# Patient Record
Sex: Male | Born: 1975
Health system: Southern US, Community
[De-identification: ages and names within clinical notes are randomized; demographics above are authoritative.]

## PROBLEM LIST (undated history)

## (undated) HISTORY — PX: NO PAST SURGERIES: SHX2092

---

## 2015-03-30 ENCOUNTER — Emergency Department
Admission: EM | Admit: 2015-03-30 | Discharge: 2015-03-30 | Disposition: A | Discharge status: Home / Self Care | Payer: Medicaid Other | Attending: Emergency Medicine | Admitting: Emergency Medicine

## 2015-03-30 ENCOUNTER — Emergency Department: Payer: Medicaid Other

## 2015-03-30 ENCOUNTER — Encounter: Payer: Self-pay | Admitting: *Deleted

## 2015-03-30 DIAGNOSIS — R079 Chest pain, unspecified: Secondary | ICD-10-CM | POA: Diagnosis present

## 2015-03-30 DIAGNOSIS — F17200 Nicotine dependence, unspecified, uncomplicated: Secondary | ICD-10-CM | POA: Diagnosis not present

## 2015-03-30 DIAGNOSIS — J209 Acute bronchitis, unspecified: Secondary | ICD-10-CM | POA: Diagnosis not present

## 2015-03-30 LAB — CBC
HCT: 45.1 % (ref 40.0–52.0)
Hemoglobin: 14.9 g/dL (ref 13.0–18.0)
MCH: 30.1 pg (ref 26.0–34.0)
MCHC: 33 g/dL (ref 32.0–36.0)
MCV: 91.2 fL (ref 80.0–100.0)
PLATELETS: 287 10*3/uL (ref 150–440)
RBC: 4.95 MIL/uL (ref 4.40–5.90)
RDW: 13.2 % (ref 11.5–14.5)
WBC: 8 10*3/uL (ref 3.8–10.6)

## 2015-03-30 LAB — BASIC METABOLIC PANEL
Anion gap: 4 — ABNORMAL LOW (ref 5–15)
BUN: 14 mg/dL (ref 6–20)
CALCIUM: 8.7 mg/dL — AB (ref 8.9–10.3)
CO2: 27 mmol/L (ref 22–32)
CREATININE: 0.91 mg/dL (ref 0.61–1.24)
Chloride: 106 mmol/L (ref 101–111)
GFR calc non Af Amer: >60 mL/min (ref >60)
GLUCOSE: 101 mg/dL — AB (ref 65–99)
Potassium: 3.8 mmol/L (ref 3.5–5.1)
Sodium: 137 mmol/L (ref 135–145)

## 2015-03-30 LAB — TROPONIN I: Troponin I: <0.03 ng/mL (ref <0.031)

## 2015-03-30 MED ORDER — GUAIFENESIN 100 MG/5ML PO SOLN: 5.0000 mL | ORAL | Status: DC | PRN

## 2015-03-30 MED ORDER — AZITHROMYCIN 250 MG PO TABS: ORAL_TABLET | ORAL | Status: DC

## 2015-03-30 MED ORDER — HYDROCODONE-HOMATROPINE 5-1.5 MG/5ML PO SYRP: 5.0000 mL | ORAL_SOLUTION | Freq: Four times a day (QID) | ORAL | Status: DC | PRN

## 2015-03-30 NOTE — ED Notes (Signed)
Pt reports left side chest pain.  Pt recently had a cold and cough and hurts in left rib area.  No n/v/d.  No diaphoresis.  No sob.  cig smoker.  Pt alert

## 2015-03-30 NOTE — ED Provider Notes (Signed)
[facility] Emergency Department Provider Note  ____________________________________________  Time seen: 6:15 PM  I have reviewed the triage vital signs and the nursing notes.   HISTORY  Chief Complaint Chest Pain    HPI Jobie Popp is a 40 y.o. male who complains of left chest wall pain over the last week. It hurts when he coughs or when he moves or lies on his left side. Not exertional. No shortness of breath diaphoresis vomiting or dizziness. He is a smoker. He's had productive cough and a cold recently about 2 weeks ago with persistence of these symptoms. With forceful coughing is developed chest wall pain. His cough had become productive but is cleared again. He has not had any fever.     No past medical history on file.   There are no active problems to display for this patient.    No past surgical history on file.   Current Outpatient Rx  Name  Route  Sig  Dispense  Refill  . azithromycin (ZITHROMAX Z-PAK) 250 MG tablet      Take 2 tablets (500 mg) on  Day 1,  followed by 1 tablet (250 mg) once daily on Days 2 through 5.   6 each   0   . guaiFENesin (ROBITUSSIN) 100 MG/5ML SOLN   Oral   Take 5 mLs (100 mg total) by mouth every 4 (four) hours as needed for cough or to loosen phlegm.   120 mL   0   . HYDROcodone-homatropine (HYCODAN) 5-1.5 MG/5ML syrup   Oral   Take 5 mLs by mouth every 6 (six) hours as needed for cough.   120 mL   0      Allergies Review of patient's allergies indicates no known allergies.   No family history on file.  Social History Social History  Substance Use Topics  . Smoking status: Current Every Day Smoker  . Smokeless tobacco: Not on file  . Alcohol Use: Yes    Review of Systems  Constitutional:   No fever or chills. No weight changes Eyes:   No blurry vision or double vision.  ENT:   No sore throat. Cardiovascular:   Positive as above chest pain. Respiratory:   No dyspnea or  cough. Gastrointestinal:   Negative for abdominal pain, vomiting and diarrhea.  No BRBPR or melena. Genitourinary:   Negative for dysuria, urinary retention, bloody urine, or difficulty urinating. Musculoskeletal:   Negative for back pain. No joint swelling or pain. Skin:   Negative for rash. Neurological:   Negative for headaches, focal weakness or numbness. Psychiatric:  No anxiety or depression.   Endocrine:  No hot/cold intolerance, changes in energy, or sleep difficulty.  10-point ROS otherwise negative.  ____________________________________________   PHYSICAL EXAM:  VITAL SIGNS: ED Triage Vitals  Enc Vitals Group     BP 03/30/15 1548 154/81 mmHg     Pulse Rate 03/30/15 1548 88     Resp 03/30/15 1548 20     Temp 03/30/15 1548 98.3 F (36.8 C)     Temp Source 03/30/15 1548 Oral     SpO2 03/30/15 1548 99 %     Weight 03/30/15 1548 180 lb (81.647 kg)     Height 03/30/15 1548  (1.651 m)     Head Cir --      Peak Flow --      Pain Score 03/30/15 1550 8     Pain Loc --      Pain Edu? --  Excl. in GC? --     Vital signs reviewed, nursing assessments reviewed.   Constitutional:   Alert and oriented. Well appearing and in no distress. Eyes:   No scleral icterus. No conjunctival pallor. PERRL. EOMI ENT   Head:   Normocephalic and atraumatic.   Nose:   No congestion/rhinnorhea. No septal hematoma   Mouth/Throat:   MMM, no pharyngeal erythema. No peritonsillar mass. No uvula shift.   Neck:   No stridor. No SubQ emphysema. No meningismus. Hematological/Lymphatic/Immunilogical:   No cervical lymphadenopathy. Cardiovascular:   RRR. Normal and symmetric distal pulses are present in all extremities. No murmurs, rubs, or gallops. Respiratory:   Normal respiratory effort without tachypnea nor retractions. Breath sounds are clear and equal bilaterally. No wheezes/rales/rhonchi. Gastrointestinal:   Soft and nontender. No distention. There is no CVA tenderness.   No rebound, rigidity, or guarding. Genitourinary:   deferred Musculoskeletal:   Nontender with normal range of motion in all extremities. No joint effusions.  No lower extremity tenderness.  No edema. Left chest wall tender to the touch in the anterior inferior ribs which reproduces the pain. Neurologic:   Normal speech and language.  CN 2-10 normal. Motor grossly intact. No pronator drift.  Normal gait. No gross focal neurologic deficits are appreciated.  Skin:    Skin is warm, dry and intact. No rash noted.  No petechiae, purpura, or bullae. Psychiatric:   Mood and affect are normal. Speech and behavior are normal. Patient exhibits appropriate insight and judgment.  ____________________________________________    LABS (pertinent positives/negatives) (all labs ordered are listed, but only abnormal results are displayed) Labs Reviewed  BASIC METABOLIC PANEL - Abnormal; Notable for the following:    Glucose, Bld 101 (*)    Calcium 8.7 (*)    Anion gap 4 (*)    All other components within normal limits  TROPONIN I  CBC   ____________________________________________   EKG  Interpreted by me  Date: 03/30/2015  Rate: 81  Rhythm: normal sinus rhythm  QRS Axis: normal  Intervals: normal  ST/T Wave abnormalities: normal  Conduction Disutrbances: none  Narrative Interpretation: unremarkable      ____________________________________________    RADIOLOGY  Chest x-ray unremarkable  ____________________________________________   PROCEDURES   ____________________________________________   INITIAL IMPRESSION / ASSESSMENT AND PLAN / ED COURSE  Pertinent labs & imaging results that were available during my care of the patient were reviewed by me and considered in my medical decision making (see chart for details).  Patient presents with chest wall pain reproducible on exam. Symptomatically this is consistent with acute bronchitis related to recent viral illness. Low  suspicion for pneumonia as his white blood cell count and body temperature and other vital signs are unremarkable today. Lung exam is reassuring as well. He is very well-appearing.  Considering the patient's symptoms, medical history, and physical examination today, I have low suspicion for ACS, PE, TAD, pneumothorax, carditis, mediastinitis, pneumonia, CHF, or sepsis.  We'll discharge home with symptomatic treatment. NSAIDs, Hycodan, azithromycin if fever develops.     ____________________________________________   FINAL CLINICAL IMPRESSION(S) / ED DIAGNOSES  Final diagnoses:  Acute bronchitis, unspecified organism      Sharman Cheek, MD 03/30/15 1901

## 2015-03-30 NOTE — Discharge Instructions (Signed)

## 2015-03-30 NOTE — ED Notes (Signed)
MD Stafford at bedside. 

## 2017-09-06 ENCOUNTER — Other Ambulatory Visit: Payer: Self-pay

## 2017-09-06 ENCOUNTER — Emergency Department
Admission: EM | Admit: 2017-09-06 | Discharge: 2017-09-06 | Disposition: A | Discharge status: Home / Self Care | Payer: Self-pay | Attending: Emergency Medicine | Admitting: Emergency Medicine

## 2017-09-06 DIAGNOSIS — T310 Burns involving less than 10% of body surface: Secondary | ICD-10-CM | POA: Insufficient documentation

## 2017-09-06 DIAGNOSIS — T304 Corrosion of unspecified body region, unspecified degree: Secondary | ICD-10-CM

## 2017-09-06 DIAGNOSIS — T65891A Toxic effect of other specified substances, accidental (unintentional), initial encounter: Secondary | ICD-10-CM | POA: Insufficient documentation

## 2017-09-06 DIAGNOSIS — Y999 Unspecified external cause status: Secondary | ICD-10-CM | POA: Insufficient documentation

## 2017-09-06 DIAGNOSIS — T22542A Corrosion of first degree of left axilla, initial encounter: Secondary | ICD-10-CM | POA: Insufficient documentation

## 2017-09-06 DIAGNOSIS — T22541A Corrosion of first degree of right axilla, initial encounter: Secondary | ICD-10-CM | POA: Insufficient documentation

## 2017-09-06 DIAGNOSIS — Y939 Activity, unspecified: Secondary | ICD-10-CM | POA: Insufficient documentation

## 2017-09-06 DIAGNOSIS — Y929 Unspecified place or not applicable: Secondary | ICD-10-CM | POA: Insufficient documentation

## 2017-09-06 DIAGNOSIS — Z87891 Personal history of nicotine dependence: Secondary | ICD-10-CM | POA: Insufficient documentation

## 2017-09-06 DIAGNOSIS — X58XXXA Exposure to other specified factors, initial encounter: Secondary | ICD-10-CM | POA: Insufficient documentation

## 2017-09-06 MED ORDER — HYDROCORTISONE 2.5 % EX OINT: TOPICAL_OINTMENT | Freq: Two times a day (BID) | CUTANEOUS | 0 refills | Status: DC

## 2017-09-06 NOTE — ED Triage Notes (Addendum)
Pt reports burning, swelling, irritation under both arms - this started this am - he states it is from the deoderant that he uses

## 2017-09-06 NOTE — ED Notes (Signed)
See triage note  Presents with irritation under both arms  No abscess noted  Thinks this be the cause of the irritation

## 2017-09-06 NOTE — ED Provider Notes (Signed)
[facility] Emergency Department Provider Note  ____________________________________________  Time seen: Approximately 11:52 AM  I have reviewed the triage vital signs and the nursing notes.   HISTORY  Chief Complaint irritation under arms   HPI Bryan Garrett is a 42 y.o. male who presents to the emergency department for treatment and evaluation of skin irritation related to deodorant.  Patient states that he purchased a deodorant gel instead of his regular type of deodorant and has been using it for the past 4 to 5 days.  Last night, he noticed that both of his underarms were burning and very irritated.  The sensation continued overnight and this morning he has noticed that his skin is peeling and is red.  No alleviating measures were attempted prior to arrival.   History reviewed. No pertinent past medical history.  There are no active problems to display for this patient.   History reviewed. No pertinent surgical history.  Prior to Admission medications   Medication Sig Start Date End Date Taking? Authorizing Provider  azithromycin (ZITHROMAX Z-PAK) 250 MG tablet Take 2 tablets (500 mg) on  Day 1,  followed by 1 tablet (250 mg) once daily on Days 2 through 5. 03/30/15   Sharman CheekStafford, Phillip, MD  guaiFENesin (ROBITUSSIN) 100 MG/5ML SOLN Take 5 mLs (100 mg total) by mouth every 4 (four) hours as needed for cough or to loosen phlegm. 03/30/15   Sharman CheekStafford, Phillip, MD  HYDROcodone-homatropine [facility](HYCODAN) 5-1.5 MG/5ML syrup Take 5 mLs by mouth every 6 (six) hours as needed for cough. 03/30/15   Sharman CheekStafford, Phillip, MD  hydrocortisone 2.5 % ointment Apply topically 2 (two) times daily. 09/06/17   Chinita Pesterriplett, Liana Camerer B, FNP    Allergies Patient has no known allergies.  No family history on file.  Social History Social History   Tobacco Use  . Smoking status: Former Games developermoker  . Smokeless tobacco: Never Used  Substance Use Topics  . Alcohol use: Yes    Comment: occ  .  Drug use: Never    Review of Systems  Constitutional: Negative for fever. Respiratory: Negative for cough or shortness of breath.  Musculoskeletal: Negative for myalgias Skin: Positive for skin irritation under both arms Neurological: Negative for numbness or paresthesias. ____________________________________________   PHYSICAL EXAM:  VITAL SIGNS: ED Triage Vitals  Enc Vitals Group     BP 09/06/17 1039 (!) 148/99     Pulse Rate 09/06/17 1039 69     Resp --      Temp 09/06/17 1039 98.4 F (36.9 C)     Temp Source 09/06/17 1039 Oral     SpO2 09/06/17 1039 97 %     Weight 09/06/17 1039 170 lb (77.1 kg)     Height 09/06/17 1039 5\' 5"  (1.651 m)     Head Circumference --      Peak Flow --      Pain Score 09/06/17 1046 7     Pain Loc --      Pain Edu? --      Excl. in GC? --      Constitutional: Well appearing. Eyes: Conjunctivae are clear without discharge or drainage. Nose: No rhinorrhea noted. Mouth/Throat: Airway is patent.  Neck: No stridor. Unrestricted range of motion observed. Cardiovascular: Capillary refill is <3 seconds.  Respiratory: Respirations are even and unlabored.. Musculoskeletal: Unrestricted range of motion observed. Neurologic: Awake, alert, and oriented x 4.  Skin: Bilateral axilla erythematous without fissures or drainage.  ____________________________________________   LABS (all labs ordered  are listed, but only abnormal results are displayed)  Labs Reviewed - No data to display ____________________________________________  EKG  Not indicated. ____________________________________________  RADIOLOGY  Not indicated ____________________________________________   PROCEDURES  Procedures ____________________________________________   INITIAL IMPRESSION / ASSESSMENT AND PLAN / ED COURSE  Bryan Garrett is a 42 y.o. male who presents to the emergency department for treatment and evaluation of bilateral axilla irritation secondary  to a new deodorant.  Patient was encouraged to dispose of that deodorant and avoid using any type of deodorant that contains aluminum.  He will be prescribed hydrocortisone cream and encouraged to use it 2 times per day until healed.  He is to follow-up with the primary care provider of his choice for symptoms that are not improving over the next few days or return to the emergency department for symptoms of change or worsen.   Medications - No data to display   Pertinent labs & imaging results that were available during my care of the patient were reviewed by me and considered in my medical decision making (see chart for details).  ____________________________________________   FINAL CLINICAL IMPRESSION(S) / ED DIAGNOSES  Final diagnoses:  Chemical burn    ED Discharge Orders        Ordered    hydrocortisone 2.5 % ointment  2 times daily     09/06/17 1150       Note:  This document was prepared using Dragon voice recognition software and may include unintentional dictation errors.    Chinita Pester, FNP 09/06/17 1155    Nita Sickle, MD 09/07/17 [phone]

## 2018-03-19 ENCOUNTER — Other Ambulatory Visit: Payer: Self-pay

## 2018-03-19 ENCOUNTER — Emergency Department
Admission: EM | Admit: 2018-03-19 | Discharge: 2018-03-19 | Disposition: A | Discharge status: Home / Self Care | Payer: Medicaid Other | Attending: Emergency Medicine | Admitting: Emergency Medicine

## 2018-03-19 DIAGNOSIS — R300 Dysuria: Secondary | ICD-10-CM | POA: Diagnosis present

## 2018-03-19 DIAGNOSIS — Z87891 Personal history of nicotine dependence: Secondary | ICD-10-CM | POA: Insufficient documentation

## 2018-03-19 DIAGNOSIS — A64 Unspecified sexually transmitted disease: Secondary | ICD-10-CM | POA: Diagnosis not present

## 2018-03-19 DIAGNOSIS — R369 Urethral discharge, unspecified: Secondary | ICD-10-CM | POA: Insufficient documentation

## 2018-03-19 LAB — CHLAMYDIA/NGC RT PCR (ARMC ONLY)
CHLAMYDIA TR: NOT DETECTED
N gonorrhoeae: NOT DETECTED

## 2018-03-19 MED ORDER — CEFTRIAXONE SODIUM 250 MG IJ SOLR
250.0000 mg | Freq: Once | INTRAMUSCULAR | Status: AC
  Administered 2018-03-19: 250 mg via INTRAMUSCULAR
  Filled 2018-03-19: qty 250

## 2018-03-19 MED ORDER — AZITHROMYCIN 500 MG PO TABS
1000.0000 mg | ORAL_TABLET | Freq: Once | ORAL | Status: AC
Start: 2018-03-19 — End: 2018-03-19
  Administered 2018-03-19: 1000 mg via ORAL
  Filled 2018-03-19: qty 2

## 2018-03-19 NOTE — ED Provider Notes (Signed)
[facility] Emergency Department Provider Note   ____________________________________________    I have reviewed the triage vital signs and the nursing notes.   HISTORY  Chief Complaint Groin Swelling     HPI Bryan Garrett is a 43 y.o. male who presents with complaints of dysuria and penile discharge.  This morning he thought his left testicle was tender but no longer.  Recent new sexual partner.   History reviewed. No pertinent past medical history.  There are no active problems to display for this patient.   History reviewed. No pertinent surgical history.  Prior to Admission medications   Medication Sig Start Date End Date Taking? Authorizing Provider  azithromycin (ZITHROMAX Z-PAK) 250 MG tablet Take 2 tablets (500 mg) on  Day 1,  followed by 1 tablet (250 mg) once daily on Days 2 through 5. 03/30/15   Sharman Cheek, MD  guaiFENesin (ROBITUSSIN) 100 MG/5ML SOLN Take 5 mLs (100 mg total) by mouth every 4 (four) hours as needed for cough or to loosen phlegm. 03/30/15   Sharman Cheek, MD  HYDROcodone-homatropine [facility]) 5-1.5 MG/5ML syrup Take 5 mLs by mouth every 6 (six) hours as needed for cough. 03/30/15   Sharman Cheek, MD  hydrocortisone 2.5 % ointment Apply topically 2 (two) times daily. 09/06/17   Chinita Pester, FNP     Allergies Patient has no known allergies.  History reviewed. No pertinent family history.  Social History Social History   Tobacco Use  . Smoking status: Former Games developer  . Smokeless tobacco: Never Used  Substance Use Topics  . Alcohol use: Yes    Comment: occ  . Drug use: Never    Review of Systems  Constitutional: No fever/chills  ENT: No sore throat.   Gastrointestinal: No abdominal pain.  No nausea, no vomiting.   Genitourinary: As above Musculoskeletal: No joint pain Skin: Negative for rash.     ____________________________________________   PHYSICAL EXAM:  VITAL  SIGNS: ED Triage Vitals  Enc Vitals Group     BP 03/19/18 1417 (!) 149/94     Pulse Rate 03/19/18 1417 62     Resp 03/19/18 1417 16     Temp 03/19/18 1417 98.4 F (36.9 C)     Temp Source 03/19/18 1417 Oral     SpO2 03/19/18 1417 100 %     Weight 03/19/18 1416 75.8 kg (167 lb)     Height 03/19/18 1416 1.651 m (5\' 5" )     Head Circumference --      Peak Flow --      Pain Score 03/19/18 1416 6     Pain Loc --      Pain Edu? --      Excl. in GC? --      Constitutional: Alert and oriented. No acute distress. P Eyes: Conjunctivae are normal.    Mouth/Throat: Mucous membranes are moist.   Cardiovascular: Normal rate, regular rhythm.  Respiratory: Normal respiratory effort.  No retractions. Genitourinary: Clear penile discharge, no rash, no testicular swelling or tenderness Musculoskeletal: No joint pain or swelling Neurologic:  Normal speech and language. No gross focal neurologic deficits are appreciated.   Skin:  Skin is warm, dry and intact. No rash noted.   ____________________________________________   LABS (all labs ordered are listed, but only abnormal results are displayed)  Labs Reviewed  CHLAMYDIA/NGC RT PCR [facility] ONLY)   ____________________________________________  EKG   ____________________________________________  RADIOLOGY   ____________________________________________   PROCEDURES  Procedure(s) performed:  No  Procedures   Critical Care performed: No ____________________________________________   INITIAL IMPRESSION / ASSESSMENT AND PLAN / ED COURSE  Pertinent labs & imaging results that were available during my care of the patient were reviewed by me and considered in my medical decision making (see chart for details).  Patient presentation concerning for STI, will treat with Rocephin and azithromycin.   ____________________________________________   FINAL CLINICAL IMPRESSION(S) / ED DIAGNOSES  Final diagnoses:  STI  (sexually transmitted infection)      NEW MEDICATIONS STARTED DURING THIS VISIT:  New Prescriptions   No medications on file     Note:  This document was prepared using Dragon voice recognition software and may include unintentional dictation errors.   Jene EveryKinner, Celes Dedic, MD 03/19/18 [phone]

## 2018-03-19 NOTE — ED Notes (Signed)
Med hold until 1510.

## 2018-03-19 NOTE — ED Notes (Signed)
Pt reports that he began to notice two bumps to testicles today with some left sided testicular swelling and penile discharge.

## 2018-03-19 NOTE — Discharge Instructions (Signed)
No sexual activity x 1 week. Recommend follow up with health department for additional std testing

## 2018-03-19 NOTE — ED Triage Notes (Signed)
Pt A&O, ambulatory. No distress noted. States L testicle is swollen. Noticed yesterday. No discharge. "this morning I noticed 2 like bumps also"

## 2019-11-09 ENCOUNTER — Ambulatory Visit: Admit: 2019-11-09 | Discharge status: Absent without leave | Payer: Self-pay

## 2019-11-13 ENCOUNTER — Encounter: Payer: Self-pay | Admitting: Emergency Medicine

## 2019-11-13 ENCOUNTER — Ambulatory Visit
Admission: RE | Admit: 2019-11-13 | Disposition: A | Discharge status: Home / Self Care | Payer: Self-pay | Source: Ambulatory Visit

## 2019-11-13 ENCOUNTER — Other Ambulatory Visit: Payer: Self-pay

## 2019-11-13 ENCOUNTER — Ambulatory Visit
Admission: EM | Admit: 2019-11-13 | Discharge: 2019-11-13 | Disposition: A | Discharge status: Home / Self Care | Payer: Medicaid Other | Attending: Physician Assistant | Admitting: Physician Assistant

## 2019-11-13 ENCOUNTER — Telehealth (HOSPITAL_COMMUNITY): Payer: Self-pay | Admitting: Nurse Practitioner

## 2019-11-13 DIAGNOSIS — Z20822 Contact with and (suspected) exposure to covid-19: Secondary | ICD-10-CM | POA: Diagnosis not present

## 2019-11-13 DIAGNOSIS — U071 COVID-19: Secondary | ICD-10-CM

## 2019-11-13 MED ORDER — AMOXICILLIN-POT CLAVULANATE 875-125 MG PO TABS: 1.0000 | ORAL_TABLET | Freq: Two times a day (BID) | ORAL | 0 refills | Status: DC

## 2019-11-13 NOTE — ED Provider Notes (Addendum)
[facility]    CSN: [id number] Arrival date & time: 11/13/19  3235      History   Chief Complaint Chief Complaint  Patient presents with  . Appointment  . Fever  . Covid Exposure    HPI Bryan Garrett is a 44 y.o. male who presents with onset of fever up to 100.6 Body aches, cough, nose congestion x 3 days. His wife and kids are covid positive. Has been taking OTC cold meds and Tylenol, elderberry vit C and multivitamins. Has been quarantined since 5 days ago. Denies HA, loss of taste or smell. His wife wanted him to get and MAB. He does not have any chronic conditions.  He does not feel worse since symptoms started and his temp was only 99 this am.    History reviewed. No pertinent past medical history.  There are no problems to display for this patient.   Past Surgical History:  Procedure Laterality Date  . NO PAST SURGERIES         Home Medications    Prior to Admission medications   Not on File    Family History Family History  Problem Relation Age of Onset  . Healthy Mother   . Healthy Father     Social History Social History   Tobacco Use  . Smoking status: Current Every Day Smoker    Packs/day: 0.50    Types: Cigarettes  . Smokeless tobacco: Never Used  Vaping Use  . Vaping Use: Former  Substance Use Topics  . Alcohol use: Yes    Comment: occ  . Drug use: Not Currently     Allergies   Patient has no known allergies.   Review of Systems Review of Systems  Constitutional: Positive for appetite change, chills, diaphoresis and fatigue. Negative for fever.  HENT: Positive for congestion. Negative for ear discharge and ear pain.   Respiratory: Positive for cough. Negative for shortness of breath.   Cardiovascular: Negative for chest pain.  Gastrointestinal: Negative for abdominal pain, diarrhea, nausea and vomiting.  Musculoskeletal: Positive for myalgias. Negative for gait problem.  Neurological: Negative for headaches.    Hematological: Negative for adenopathy.     Physical Exam Triage Vital Signs ED Triage Vitals  Enc Vitals Group     BP 11/13/19 1035 (!) 134/104     Pulse Rate 11/13/19 1035 60     Resp 11/13/19 1035 18     Temp 11/13/19 1035 98.9 F (37.2 C)     Temp Source 11/13/19 1035 Oral     SpO2 11/13/19 1035 100 %     Weight 11/13/19 1032 167 lb 1.7 oz (75.8 kg)     Height 11/13/19 1032 5\' 5"  (1.651 m)     Head Circumference --      Peak Flow --      Pain Score 11/13/19 1031 8     Pain Loc --      Pain Edu? --      Excl. in GC? --    No data found.  Updated Vital Signs BP (!) 134/104 (BP Location: Right Arm)   Pulse 60   Temp 98.9 F (37.2 C) (Oral)   Resp 18   Ht 5\' 5"  (1.651 m)   Wt 167 lb 1.7 oz (75.8 kg)   SpO2 100%   BMI 27.81 kg/m   Visual Acuity Right Eye Distance:   Left Eye Distance:   Bilateral Distance:    Right Eye Near:   Left Eye  Near:    Bilateral Near:     Physical Exam Vitals and nursing note reviewed.  Constitutional:      Appearance: He is normal weight.  HENT:     Head: Normocephalic.     Right Ear: Tympanic membrane, ear canal and external ear normal.     Left Ear: Tympanic membrane, ear canal and external ear normal.     Nose: Nose normal.     Mouth/Throat:     Mouth: Mucous membranes are moist.  Eyes:     General: No scleral icterus.    Conjunctiva/sclera: Conjunctivae normal.  Cardiovascular:     Rate and Rhythm: Normal rate and regular rhythm.  Pulmonary:     Effort: Pulmonary effort is normal.  Musculoskeletal:        General: Normal range of motion.     Cervical back: Neck supple.  Lymphadenopathy:     Cervical: No cervical adenopathy.  Skin:    General: Skin is warm and dry.  Neurological:     Mental Status: He is alert and oriented to person, place, and time.     Gait: Gait normal.  Psychiatric:        Mood and Affect: Mood normal.        Behavior: Behavior normal.        Thought Content: Thought content normal.         Judgment: Judgment normal.      UC Treatments / Results  Labs (all labs ordered are listed, but only abnormal results are displayed) Labs Reviewed  SARS CORONAVIRUS 2 (TAT 6-24 HRS)    EKG   Radiology No results found.  Procedures Procedures (including critical care time)  Medications Ordered in UC Medications - No data to display  Initial Impression / Assessment and Plan / UC Course  I have reviewed the triage vital signs and the nursing notes. I explained to him that he is not a candidate for MAB since his BMI is 27, I sent them the Infusion clinic and left VM for pt to call them if he does not hear from them in 48 if covid test is +.   Final Clinical Impressions(s) / UC Diagnoses   Final diagnoses:  None   Discharge Instructions   None    ED Prescriptions    None     PDMP not reviewed this encounter.   Garey Ham, PA-C 11/13/19 1132    Rodriguez-Southworth, Nettie Elm, PA-C 11/13/19 1529

## 2019-11-13 NOTE — ED Triage Notes (Signed)
Pt c/o fever, back pain, body aches, cough, and nasal congestion. Started about 3 days ago. He states his wife and kids are covid positive.

## 2019-11-13 NOTE — Discharge Instructions (Addendum)
If your Covid test ends up positive you may take the following supplements to help your immune system be stronger to fight this viral infection Take Quarcetin 500 mg three times a day x 7 days with Zinc 50 mg ones a day x 7 days. The quarcetin is an antiviral and anti-inflammatory supplement which helps open the zinc channels in the cell to absorb Zinc. Zinc helps decrease the virus load in your body. Take Melatonin 6-10 mg at bed time which also helps support your immune system.  Also make sure to take Vit D 5,000 IU per day with a fatty meal and Vit C 1000 mg a day until you are completely better. Stay on Vitamin D 2,000  and C the rest of the season.  Don't lay around, keep active and walk as much as you are able to to prevent worsening of your symptoms.  Follow up with your family Dr next week.  If you get short of breath and you are able to check  your oxygen with a pulse oxygen meter, if it gets to 92% or less, you need to go to the hospital to be admitted. If you dont have one, come back here and we will assess you.   

## 2019-11-13 NOTE — Telephone Encounter (Signed)
Called to Discuss with patient about Covid symptoms and the use of regeneron, a monoclonal antibody infusion for those with mild to moderate Covid symptoms and at a high risk of hospitalization.     Pt is qualified for this infusion at the [facility] infusion center due to co-morbid conditions and/or a member of an at-risk group.     Unable to reach pt. Left message to return call  Joyelle Siedlecki, DNP, AGNP-C [phone] (Infusion Center Hotline)  

## 2019-11-14 LAB — SARS CORONAVIRUS 2 (TAT 6-24 HRS): SARS Coronavirus 2: POSITIVE — AB

## 2019-11-15 ENCOUNTER — Telehealth: Payer: Self-pay | Admitting: Critical Care Medicine

## 2019-11-15 NOTE — Telephone Encounter (Signed)
Called to Discuss with patient about Covid symptoms and the use of the monoclonal antibody infusion for those with mild to moderate Covid symptoms and at a high risk of hospitalization.     Pt is qualified for this infusion due to co-morbid conditions and/or a member of an at-risk group.     There are no problems to display for this patient.   Patient declines infusion at this time. Symptoms tier reviewed as well as criteria for ending isolation. Preventative practices reviewed. Patient verbalized understanding.    Patient advised to call back if he/she decides that he/she does want to get infusion. Callback number to the infusion center given. Patient advised to go to Urgent care or ED with severe symptoms.     

## 2019-11-17 ENCOUNTER — Encounter: Payer: Self-pay | Admitting: Urgent Care

## 2019-11-17 ENCOUNTER — Emergency Department
Admission: EM | Admit: 2019-11-17 | Discharge: 2019-11-17 | Discharge status: Against Medical Advice | Payer: Medicaid Other

## 2019-11-17 ENCOUNTER — Ambulatory Visit
Admission: EM | Admit: 2019-11-17 | Discharge: 2019-11-17 | Disposition: A | Discharge status: Home / Self Care | Payer: Medicaid Other | Attending: Urgent Care | Admitting: Urgent Care

## 2019-11-17 ENCOUNTER — Ambulatory Visit: Admit: 2019-11-17 | Disposition: A | Discharge status: Home / Self Care | Payer: Self-pay

## 2019-11-17 DIAGNOSIS — U071 COVID-19: Secondary | ICD-10-CM

## 2019-11-17 DIAGNOSIS — R0602 Shortness of breath: Secondary | ICD-10-CM

## 2019-11-17 MED ORDER — ALBUTEROL SULFATE HFA 108 (90 BASE) MCG/ACT IN AERS: 1.0000 | INHALATION_SPRAY | Freq: Four times a day (QID) | RESPIRATORY_TRACT | 0 refills | Status: DC | PRN

## 2019-11-17 MED ORDER — BENZONATATE 100 MG PO CAPS: 100.0000 mg | ORAL_CAPSULE | Freq: Three times a day (TID) | ORAL | 0 refills | Status: DC | PRN

## 2019-11-17 MED ORDER — TIZANIDINE HCL 4 MG PO TABS: 4.0000 mg | ORAL_TABLET | Freq: Four times a day (QID) | ORAL | 0 refills | Status: DC | PRN

## 2019-11-17 MED ORDER — PROMETHAZINE-DM 6.25-15 MG/5ML PO SYRP: 5.0000 mL | ORAL_SOLUTION | Freq: Every evening | ORAL | 0 refills | Status: AC | PRN

## 2019-11-17 NOTE — Discharge Instructions (Signed)
I will refer you to the infusion clinic. Feel free to call and make an appointment.   Outpatient Infusion Center hotline number: [phone] [address], [place], [place] [address].

## 2019-11-17 NOTE — ED Triage Notes (Signed)
C/o shortness of breath since yesterday.

## 2019-11-17 NOTE — ED Provider Notes (Signed)
  Bryan Garrett   MRN: [id number] DOB: 24-Mar-1975  Subjective:   Bryan Garrett is a 44 y.o. male presenting for 2-day history of persistent and worsening shortness of breath, dizziness and fevers.  Has been using Gatorade and Pedialyte, Tylenol. Patient has already been seen at the emergency room at [facility]. Tested positive for COVID-19 on 11/13/2019. He was contacted by the Covid infusion clinic and refused treatment.  No current facility-administered medications for this encounter. No current outpatient medications on file.   No Known Allergies  History reviewed. No pertinent past medical history.   Past Surgical History:  Procedure Laterality Date  . NO PAST SURGERIES      Family History  Problem Relation Age of Onset  . Healthy Mother   . Healthy Father     Social History   Tobacco Use  . Smoking status: Current Every Day Smoker    Packs/day: 0.50    Types: Cigarettes  . Smokeless tobacco: Never Used  Vaping Use  . Vaping Use: Former  Substance Use Topics  . Alcohol use: Yes    Comment: occ  . Drug use: Not Currently    ROS   Objective:   Vitals: BP (!) 153/97   Pulse 95   Temp 99.8 F (37.7 C)   Resp 17   SpO2 98%   Physical Exam Constitutional:      General: He is not in acute distress.    Appearance: Normal appearance. He is well-developed. He is not ill-appearing, toxic-appearing or diaphoretic.  HENT:     Head: Normocephalic and atraumatic.     Right Ear: External ear normal.     Left Ear: External ear normal.     Nose: Nose normal.     Mouth/Throat:     Mouth: Mucous membranes are moist.     Pharynx: Oropharynx is clear.  Eyes:     General: No scleral icterus.    Extraocular Movements: Extraocular movements intact.     Pupils: Pupils are equal, round, and reactive to light.  Cardiovascular:     Rate and Rhythm: Normal rate and regular rhythm.     Heart sounds: Normal heart sounds. No murmur heard.  No friction rub.  No gallop.   Pulmonary:     Effort: Pulmonary effort is normal. No respiratory distress.     Breath sounds: Normal breath sounds. No stridor. No wheezing, rhonchi or rales.  Neurological:     Mental Status: He is alert and oriented to person, place, and time.  Psychiatric:        Mood and Affect: Mood normal.        Behavior: Behavior normal.        Thought Content: Thought content normal.      Assessment and Plan :   PDMP not reviewed this encounter.  1. Shortness of breath   2. COVID-19     Emphasized need to follow-up with the infusion treatment.  Use supportive care otherwise.  Patient will try and set up an appointment.  Provided him with a prescription for an albuterol inhaler, cough medications, muscle relaxant for his back pains and spasms from coughing. Counseled patient on potential for adverse effects with medications prescribed/recommended today, ER and return-to-clinic precautions discussed, patient verbalized understanding.    Wallis Bamberg, [place] 11/17/19 1559

## 2019-11-17 NOTE — ED Triage Notes (Signed)
Pt and wife upset at wait time. Pt wife verbally aggressive to staff and reports they are leaving. Pt up out of WC and left with wife.

## 2019-11-18 ENCOUNTER — Telehealth: Payer: Self-pay | Admitting: *Deleted

## 2019-11-18 ENCOUNTER — Other Ambulatory Visit: Payer: Self-pay | Admitting: Physician Assistant

## 2019-11-18 ENCOUNTER — Ambulatory Visit (HOSPITAL_COMMUNITY): Admission: RE | Admit: 2019-11-18 | Payer: Medicaid Other | Source: Ambulatory Visit

## 2019-11-18 DIAGNOSIS — U071 COVID-19: Secondary | ICD-10-CM

## 2019-11-18 DIAGNOSIS — E663 Overweight: Secondary | ICD-10-CM

## 2019-11-18 NOTE — Telephone Encounter (Addendum)
Transition Care Management Unsuccessful Follow-up Telephone Call  Date of discharge and from where:  11/17/19, [facility] at [facility]  Attempts:  1st Attempt  Reason for unsuccessful TCM follow-up call:  Left voice message.  Burnard Bunting, RN, BSN, CCRN Patient Engagement Center [phone]

## 2019-11-18 NOTE — Telephone Encounter (Signed)
Bryan Garrett presented to the ED and left before being seen by the provider on 11/17/19. The patient has been enrolled in an automated general discharge outreach program and 2 attempts to contact the patient will be made to follow up on their ED visit and subsequent needs. The care management team is available to provide assistance to this patient at any time.   Burnard Bunting, RN, BSN, CCRN Patient Engagement Center [phone]

## 2019-11-18 NOTE — Progress Notes (Signed)
I connected by phone with Bryan Garrett on 11/18/2019 at 9:24 AM to discuss the potential use of a new treatment for mild to moderate COVID-19 viral infection in non-hospitalized patients.  This patient is a 44 y.o. male that meets the FDA criteria for Emergency Use Authorization of COVID monoclonal antibody casirivimab/imdevimab.  Has a (+) direct SARS-CoV-2 viral test result  Has mild or moderate COVID-19   Is NOT hospitalized due to COVID-19  Is within 10 days of symptom onset  Has at least one of the high risk factor(s) for progression to severe COVID-19 and/or hospitalization as defined in EUA.  Specific high risk criteria : BMI > 25   I have spoken and communicated the following to the patient or parent/caregiver regarding COVID monoclonal antibody treatment:  1. FDA has authorized the emergency use for the treatment of mild to moderate COVID-19 in adults and pediatric patients with positive results of direct SARS-CoV-2 viral testing who are 46 years of age and older weighing at least 40 kg, and who are at high risk for progressing to severe COVID-19 and/or hospitalization.  2. The significant known and potential risks and benefits of COVID monoclonal antibody, and the extent to which such potential risks and benefits are unknown.  3. Information on available alternative treatments and the risks and benefits of those alternatives, including clinical trials.  4. Patients treated with COVID monoclonal antibody should continue to self-isolate and use infection control measures (e.g., wear mask, isolate, social distance, avoid sharing personal items, clean and disinfect "high touch" surfaces, and frequent handwashing) according to CDC guidelines.   5. The patient or parent/caregiver has the option to accept or refuse COVID monoclonal antibody treatment.  After reviewing this information with the patient, The patient agreed to proceed with receiving casirivimab\imdevimab infusion  and will be provided a copy of the Fact sheet prior to receiving the infusion.    [name] 11/18/2019 9:24 AM

## 2019-11-19 ENCOUNTER — Telehealth: Payer: Self-pay | Admitting: *Deleted

## 2019-11-19 NOTE — Telephone Encounter (Signed)
Transition Care Management Follow-up Telephone Call  Date of discharge and from where: 11/17/19, [facility] at [facility]  How have you been since you were released from the hospital? "I'm doing better"  Any questions or concerns? No  Items Reviewed:  Did the pt receive and understand the discharge instructions provided? Yes   Medications obtained and verified? Yes   Any new allergies since your discharge? No   Dietary orders reviewed? YES  Do you have support at home? Yes family  Functional Questionnaire: (I = Independent and D = Dependent) ADLs: I  Bathing/Dressing- I  Meal Prep- I  Eating-  I  Maintaining continence- I  Transferring/Ambulation- I  Managing Meds- I  Follow up appointments reviewed:   PCP Hospital f/u appt confirmed? No Patient would like to be assigned a PCP  Specialist Hospital f/u appt confirmed? Yes  Patient had antibody infusion on 11/18/19.  Are transportation arrangements needed? No   If their condition worsens, is the pt aware to call PCP or go to the Emergency Dept.? YES  Was the patient provided with contact information for the PCP's office or ED? YES  Was to pt encouraged to call back with questions or concerns? YES  Burnard Bunting, RN, BSN, CCRN Patient Engagement Center [phone]

## 2019-11-21 ENCOUNTER — Ambulatory Visit: Admit: 2019-11-21 | Discharge status: Absent without leave | Payer: Self-pay

## 2019-11-21 ENCOUNTER — Ambulatory Visit
Admission: EM | Admit: 2019-11-21 | Discharge: 2019-11-21 | Disposition: A | Discharge status: Home / Self Care | Payer: Medicaid Other | Attending: Internal Medicine | Admitting: Internal Medicine

## 2019-11-21 ENCOUNTER — Ambulatory Visit (INDEPENDENT_AMBULATORY_CARE_PROVIDER_SITE_OTHER): Payer: Medicaid Other

## 2019-11-21 ENCOUNTER — Encounter: Payer: Self-pay | Admitting: Emergency Medicine

## 2019-11-21 ENCOUNTER — Other Ambulatory Visit: Payer: Self-pay

## 2019-11-21 DIAGNOSIS — R042 Hemoptysis: Secondary | ICD-10-CM

## 2019-11-21 DIAGNOSIS — U071 COVID-19: Secondary | ICD-10-CM

## 2019-11-21 DIAGNOSIS — I499 Cardiac arrhythmia, unspecified: Secondary | ICD-10-CM | POA: Diagnosis not present

## 2019-11-21 DIAGNOSIS — R0789 Other chest pain: Secondary | ICD-10-CM | POA: Diagnosis not present

## 2019-11-21 DIAGNOSIS — R0602 Shortness of breath: Secondary | ICD-10-CM | POA: Diagnosis not present

## 2019-11-21 DIAGNOSIS — R079 Chest pain, unspecified: Secondary | ICD-10-CM | POA: Diagnosis not present

## 2019-11-21 DIAGNOSIS — R069 Unspecified abnormalities of breathing: Secondary | ICD-10-CM | POA: Diagnosis not present

## 2019-11-21 MED ORDER — BENZONATATE 100 MG PO CAPS: 200.0000 mg | ORAL_CAPSULE | Freq: Three times a day (TID) | ORAL | 0 refills | Status: DC | PRN

## 2019-11-21 NOTE — ED Provider Notes (Signed)
[facility]    CSN: [id number] Arrival date & time: 11/21/19  7371      History   Chief Complaint Chief Complaint  Patient presents with  . Covid Positive  . Cough    HPI Bryan Garrett is a 44 y.o. male who presents due to having bloodd in his sputum from coughing since yesterday. Initally was pink, and later last pm brown, and this am with streaks of bright blood. Cough was worse today. Had MAB at [facility] 2 days ago. He has also been getting L mid thoracic pain. Had temp of 99 yesterday. No fever noted today. Has been having sweats.  He also needs paper work filled out for his job  For missing work and his Dx of Covid.  History reviewed. No pertinent past medical history.  There are no problems to display for this patient.   Past Surgical History:  Procedure Laterality Date  . NO PAST SURGERIES         Home Medications    Prior to Admission medications   Medication Sig Start Date End Date Taking? Authorizing Provider  albuterol (VENTOLIN HFA) 108 (90 Base) MCG/ACT inhaler Inhale 1-2 puffs into the lungs every 6 (six) hours as needed for wheezing or shortness of breath. 11/17/19  Yes Wallis Bamberg, PA-C  promethazine-dextromethorphan (PROMETHAZINE-DM) 6.25-15 MG/5ML syrup Take 5 mLs by mouth at bedtime as needed for cough. 11/17/19  Yes Wallis Bamberg, PA-C  tiZANidine (ZANAFLEX) 4 MG tablet Take 1 tablet (4 mg total) by mouth every 6 (six) hours as needed for muscle spasms. 11/17/19  Yes Wallis Bamberg, PA-C  benzonatate (TESSALON) 100 MG capsule Take 2 capsules (200 mg total) by mouth 3 (three) times daily as needed for cough. 11/21/19   Rodriguez-Southworth, Nettie Elm, PA-C    Family History Family History  Problem Relation Age of Onset  . Healthy Mother   . Healthy Father     Social History Social History   Tobacco Use  . Smoking status: Current Every Day Smoker    Packs/day: 0.50    Types: Cigarettes  . Smokeless tobacco: Never Used  Vaping Use  .  Vaping Use: Former  Substance Use Topics  . Alcohol use: Yes    Comment: occ  . Drug use: Not Currently     Allergies   Patient has no known allergies.   Review of Systems Review of Systems  Constitutional: Positive for diaphoresis and fatigue. Negative for appetite change, chills and fever.  HENT: Negative for congestion, ear discharge, ear pain, postnasal drip, rhinorrhea, sore throat and trouble swallowing.   Eyes: Negative for discharge.  Respiratory: Positive for cough. Negative for shortness of breath and wheezing.   Cardiovascular: Negative for chest pain.  Gastrointestinal: Negative for abdominal pain.  Musculoskeletal: Positive for back pain.  Skin: Negative for rash.  Hematological: Negative for adenopathy.     Physical Exam Triage Vital Signs ED Triage Vitals  Enc Vitals Group     BP 11/21/19 1000 (!) 141/102     Pulse Rate 11/21/19 1000 72     Resp 11/21/19 1000 18     Temp 11/21/19 1000 98.6 F (37 C)     Temp Source 11/21/19 1000 Oral     SpO2 11/21/19 1000 100 %     Weight 11/21/19 0957 167 lb 1.7 oz (75.8 kg)     Height 11/21/19 0957 5\' 5"  (1.651 m)     Head Circumference --      Peak Flow --  Pain Score 11/21/19 0957 5     Pain Loc --      Pain Edu? --      Excl. in GC? --    No data found.  Updated Vital Signs BP (!) 141/102 (BP Location: Right Arm)   Pulse 72   Temp 98.6 F (37 C) (Oral)   Resp 18   Ht 5\' 5"  (1.651 m)   Wt 167 lb 1.7 oz (75.8 kg)   SpO2 100%   BMI 27.81 kg/m   Visual Acuity Right Eye Distance:   Left Eye Distance:   Bilateral Distance:    Right Eye Near:   Left Eye Near:    Bilateral Near:     Physical Exam Constitutional:      General: He is not in acute distress.    Appearance: Normal appearance. He is not toxic-appearing.  HENT:     Head: Normocephalic.     Right Ear: External ear normal.     Left Ear: External ear normal.     Nose: Nose normal.  Eyes:     General: No scleral icterus.     Conjunctiva/sclera: Conjunctivae normal.  Cardiovascular:     Rate and Rhythm: Normal rate and regular rhythm.  Pulmonary:     Effort: Pulmonary effort is normal.     Breath sounds: Normal breath sounds.  Musculoskeletal:        General: Normal range of motion.     Cervical back: Neck supple.  Skin:    General: Skin is warm and dry.     Findings: No rash.  Neurological:     Mental Status: He is alert and oriented to person, place, and time.  Psychiatric:        Mood and Affect: Mood normal.        Behavior: Behavior normal.        Thought Content: Thought content normal.        Judgment: Judgment normal.      UC Treatments / Results  Labs (all labs ordered are listed, but only abnormal results are displayed) Labs Reviewed - No data to display  EKG   Radiology DG Chest 2 View  Result Date: 11/21/2019 CLINICAL DATA:  Hemoptysis, COVID-19. EXAM: CHEST - 2 VIEW COMPARISON:  March 30, 2015. FINDINGS: The heart size and mediastinal contours are within normal limits. Both lungs are clear. The visualized skeletal structures are unremarkable. IMPRESSION: No active cardiopulmonary disease. Electronically Signed   By: April 01, 2015 M.D.   On: 11/21/2019 10:42    Procedures Procedures (including critical care time)  Medications Ordered in UC Medications - No data to display  Initial Impression / Assessment and Plan / UC Course  I have reviewed the triage vital signs and the nursing notes. Pertinent  imaging results that were available during my care of the patient were reviewed by me and considered in my medical decision making (see chart for details). I consulted this case with Dr 11/23/2019.  Hemoptysis may be tracheal or bronchial irritation only, but pt advised that if it is persistent with every cough episode to go to ER for further work  Up.  I filled out her work papers and copy was scanned in his chart.  Final Clinical Impressions(s) / UC Diagnoses   Final diagnoses:    COVID-19 virus infection  Hemoptysis     Discharge Instructions     Please follow up with a pulmonologist if coughing blood continues.     ED Prescriptions  Medication Sig Dispense Auth. Provider   benzonatate (TESSALON) 100 MG capsule Take 2 capsules (200 mg total) by mouth 3 (three) times daily as needed for cough. 30 capsule Rodriguez-Southworth, Nettie Elm, PA-C     PDMP not reviewed this encounter.   Garey Ham, [place] 11/21/19 1516

## 2019-11-21 NOTE — Discharge Instructions (Addendum)
Please follow up with a pulmonologist if coughing blood continues.

## 2019-11-21 NOTE — ED Triage Notes (Signed)
Pt tested positive for covid on 11/13/19. He states he is having cough with bloody sputum, chest tightness. Denies any new fever or shortness of breath. He is just wanting his lungs checked.

## 2020-01-16 ENCOUNTER — Ambulatory Visit: Payer: Medicaid Other | Admitting: Physician Assistant

## 2020-01-16 ENCOUNTER — Other Ambulatory Visit: Payer: Self-pay

## 2020-01-16 DIAGNOSIS — Z113 Encounter for screening for infections with a predominantly sexual mode of transmission: Secondary | ICD-10-CM | POA: Diagnosis not present

## 2020-01-16 DIAGNOSIS — Z202 Contact with and (suspected) exposure to infections with a predominantly sexual mode of transmission: Secondary | ICD-10-CM | POA: Diagnosis not present

## 2020-01-16 MED ORDER — DOXYCYCLINE HYCLATE 100 MG PO TABS: 100.0000 mg | ORAL_TABLET | Freq: Two times a day (BID) | ORAL | 0 refills | Status: AC

## 2020-01-18 ENCOUNTER — Encounter: Payer: Self-pay | Admitting: Physician Assistant

## 2020-01-18 NOTE — Progress Notes (Signed)
Chart reviewed by Pharmacist  Suzanne Walker PharmD, Contract Pharmacist at [facility]  

## 2020-01-18 NOTE — Progress Notes (Signed)
   [facility] Department STI clinic/screening visit  Subjective:  Bryan Garrett is a 44 y.o. male being seen today for an STI screening visit. The patient reports they do not have symptoms.    Patient has the following medical conditions:  There are no problems to display for this patient.    Chief Complaint  Patient presents with  . SEXUALLY TRANSMITTED DISEASE    screening    HPI  Patient reports that he is not having any symptoms but is a contact to Chlamydia. Denies chronic conditions, surgeries and regular medicines.  States last HIV test was about 1 year ago.   See flowsheet for further details and programmatic requirements.    The following portions of the patient's history were reviewed and updated as appropriate: allergies, current medications, past medical history, past social history, past surgical history and problem list.  Objective:  There were no vitals filed for this visit.  Physical Exam Constitutional:      General: He is not in acute distress.    Appearance: Normal appearance.  HENT:     Head: Normocephalic and atraumatic.  Eyes:     Conjunctiva/sclera: Conjunctivae normal.  Pulmonary:     Effort: Pulmonary effort is normal.  Skin:    General: Skin is warm and dry.  Neurological:     Mental Status: He is alert and oriented to person, place, and time.  Psychiatric:        Mood and Affect: Mood normal.        Behavior: Behavior normal.        Thought Content: Thought content normal.        Judgment: Judgment normal.       Assessment and Plan:  Bryan Garrett is a 44 y.o. male presenting to the [facility] Department for STI screening  1. Screening for STD (sexually transmitted disease) Patient into clinic without symptoms. Counseled patient re:  ACHD testing and that we do not routinely test for Chlamydia specifically for males. Patient declines screening exam and blood work today.  Requests treatment only.   Rec condoms with all sex.   2. Chlamydia contact Will treat as a contact to Chlamydia with Doxycycline 100 mg #14 1 po BID for 7 days. No sex for 14 days and until after partner completes treatment. Call with questions or concerns. - doxycycline (VIBRA-TABS) 100 MG tablet; Take 1 tablet (100 mg total) by mouth 2 (two) times daily for 7 days.  Dispense: 14 tablet; Refill: 0     No follow-ups on file.  No future appointments.  Matt Holmes, PA

## 2020-02-26 ENCOUNTER — Ambulatory Visit
Admission: EM | Admit: 2020-02-26 | Discharge: 2020-02-26 | Disposition: A | Discharge status: Home / Self Care | Payer: Medicaid Other | Attending: Family Medicine | Admitting: Family Medicine

## 2020-02-26 ENCOUNTER — Other Ambulatory Visit: Payer: Self-pay

## 2020-02-26 ENCOUNTER — Ambulatory Visit: Admission: EM | Admit: 2020-02-26 | Discharge status: Absent without leave | Payer: Self-pay | Source: Home / Self Care

## 2020-02-26 DIAGNOSIS — Z20822 Contact with and (suspected) exposure to covid-19: Secondary | ICD-10-CM | POA: Diagnosis not present

## 2020-02-26 DIAGNOSIS — F17210 Nicotine dependence, cigarettes, uncomplicated: Secondary | ICD-10-CM | POA: Diagnosis not present

## 2020-02-26 DIAGNOSIS — J069 Acute upper respiratory infection, unspecified: Secondary | ICD-10-CM | POA: Diagnosis not present

## 2020-02-26 LAB — RESP PANEL BY RT-PCR (FLU A&B, COVID) ARPGX2
Influenza A by PCR: NEGATIVE
Influenza B by PCR: NEGATIVE
SARS Coronavirus 2 by RT PCR: NEGATIVE

## 2020-02-26 NOTE — Discharge Instructions (Signed)
I will call with the results.  Take care  Dr. Jayvon Mounger  

## 2020-02-26 NOTE — ED Triage Notes (Signed)
Patient states his wife is positive for covid. States that he has been having nasal congestion x 2 days. Reports that he had covid over 90 days ago and so was his wife. Reports that sons test is negative.

## 2020-02-26 NOTE — ED Provider Notes (Signed)
[facility]    CSN: [id number] Arrival date & time: 02/26/20  1026      History   Chief Complaint Chief Complaint  Patient presents with   Nasal Congestion   HPI  44 year old male presents with congestion and headache.  1.5 days of symptoms.  Reports congestion and headache.  His wife is Covid positive.  His sons girlfriend is also positive.  He has had an exposure.  He is concerned about a possibility of COVID-19.  No fever.  No other associated symptoms.  He otherwise feels well.  No other complaints concerns at this time.  Past Surgical History:  Procedure Laterality Date   NO PAST SURGERIES     Home Medications    Prior to Admission medications   Medication Sig Start Date End Date Taking? Authorizing Provider  albuterol (VENTOLIN HFA) 108 (90 Base) MCG/ACT inhaler Inhale 1-2 puffs into the lungs every 6 (six) hours as needed for wheezing or shortness of breath. 11/17/19   Wallis Bamberg, PA-C  benzonatate (TESSALON) 100 MG capsule Take 2 capsules (200 mg total) by mouth 3 (three) times daily as needed for cough. 11/21/19   Rodriguez-Southworth, Nettie Elm, PA-C  promethazine-dextromethorphan (PROMETHAZINE-DM) 6.25-15 MG/5ML syrup Take 5 mLs by mouth at bedtime as needed for cough. 11/17/19   Wallis Bamberg, PA-C  tiZANidine (ZANAFLEX) 4 MG tablet Take 1 tablet (4 mg total) by mouth every 6 (six) hours as needed for muscle spasms. 11/17/19   Wallis Bamberg, PA-C    Family History Family History  Problem Relation Age of Onset   Healthy Mother    Healthy Father     Social History Social History   Tobacco Use   Smoking status: Current Every Day Smoker    Packs/day: 0.50    Types: Cigarettes   Smokeless tobacco: Never Used  Vaping Use   Vaping Use: Former  Substance Use Topics   Alcohol use: Yes    Comment: occ   Drug use: Not Currently     Allergies   Patient has no known allergies.   Review of Systems Review of Systems Per HPI  Physical  Exam Triage Vital Signs ED Triage Vitals  Enc Vitals Group     BP 02/26/20 1150 (!) 145/97     Pulse Rate 02/26/20 1150 79     Resp 02/26/20 1150 18     Temp 02/26/20 1150 98.2 F (36.8 C)     Temp Source 02/26/20 1150 Oral     SpO2 02/26/20 1150 98 %     Weight 02/26/20 1149 179 lb (81.2 kg)     Height 02/26/20 1149 5\' 5"  (1.651 m)     Head Circumference --      Peak Flow --      Pain Score 02/26/20 1149 0     Pain Loc --      Pain Edu? --      Excl. in GC? --    Updated Vital Signs BP (!) 145/97 (BP Location: Right Arm)    Pulse 79    Temp 98.2 F (36.8 C) (Oral)    Resp 18    Ht 5\' 5"  (1.651 m)    Wt 81.2 kg    SpO2 98%    BMI 29.79 kg/m   Visual Acuity Right Eye Distance:   Left Eye Distance:   Bilateral Distance:    Right Eye Near:   Left Eye Near:    Bilateral Near:     Physical Exam Vitals and  nursing note reviewed.  Constitutional:      General: He is not in acute distress.    Appearance: Normal appearance. He is not ill-appearing.  HENT:     Head: Normocephalic and atraumatic.  Eyes:     General:        Right eye: No discharge.        Left eye: No discharge.     Conjunctiva/sclera: Conjunctivae normal.  Cardiovascular:     Rate and Rhythm: Normal rate and regular rhythm.     Heart sounds: No murmur heard.   Pulmonary:     Effort: Pulmonary effort is normal.     Breath sounds: Normal breath sounds. No wheezing, rhonchi or rales.  Neurological:     Mental Status: He is alert.  Psychiatric:        Mood and Affect: Mood normal.        Behavior: Behavior normal.    UC Treatments / Results  Labs (all labs ordered are listed, but only abnormal results are displayed) Labs Reviewed  RESP PANEL BY RT-PCR (FLU A&B, COVID) [id number]    EKG   Radiology No results found.  Procedures Procedures (including critical care time)  Medications Ordered in UC Medications - No data to display  Initial Impression / Assessment and Plan / UC Course  I have  reviewed the triage vital signs and the nursing notes.  Pertinent labs & imaging results that were available during my care of the patient were reviewed by me and considered in my medical decision making (see chart for details).    44 year old male presents with URI.  Covid testing negative.  Supportive care.  Over-the-counter medications as needed.  Final Clinical Impressions(s) / UC Diagnoses   Final diagnoses:  URI, acute     Discharge Instructions     I will call with the results.  Take care  Dr. Adriana Simas    ED Prescriptions    None     PDMP not reviewed this encounter.   Tommie Sams, [place] 02/26/20 1306

## 2020-07-21 ENCOUNTER — Emergency Department
Admission: EM | Admit: 2020-07-21 | Discharge: 2020-07-21 | Disposition: A | Discharge status: Home / Self Care | Payer: Medicaid Other | Attending: Emergency Medicine | Admitting: Emergency Medicine

## 2020-07-21 ENCOUNTER — Other Ambulatory Visit: Payer: Self-pay

## 2020-07-21 DIAGNOSIS — L02212 Cutaneous abscess of back [any part, except buttock]: Secondary | ICD-10-CM | POA: Diagnosis not present

## 2020-07-21 DIAGNOSIS — R030 Elevated blood-pressure reading, without diagnosis of hypertension: Secondary | ICD-10-CM | POA: Diagnosis not present

## 2020-07-21 DIAGNOSIS — L0291 Cutaneous abscess, unspecified: Secondary | ICD-10-CM

## 2020-07-21 DIAGNOSIS — F17210 Nicotine dependence, cigarettes, uncomplicated: Secondary | ICD-10-CM | POA: Insufficient documentation

## 2020-07-21 MED ORDER — NAPROXEN 500 MG PO TABS: 500.0000 mg | ORAL_TABLET | Freq: Two times a day (BID) | ORAL | 0 refills | Status: DC

## 2020-07-21 MED ORDER — LIDOCAINE 5 % EX PTCH
1.0000 | MEDICATED_PATCH | CUTANEOUS | Status: DC
  Administered 2020-07-21: 1 via TRANSDERMAL
  Filled 2020-07-21: qty 1

## 2020-07-21 MED ORDER — SULFAMETHOXAZOLE-TRIMETHOPRIM 800-160 MG PO TABS: 1.0000 | ORAL_TABLET | Freq: Two times a day (BID) | ORAL | 0 refills | Status: DC

## 2020-07-21 NOTE — ED Triage Notes (Signed)
Pt c/o waking with a red swollen area to the left upper back today

## 2020-07-21 NOTE — Discharge Instructions (Addendum)
Area is nonfluctuant and does not need to be incised and drained.  Follow discharge care instruction take medication as directed.  Wear Lidoderm patch for 12 hours.  Your blood pressure was elevated 162/115.  Recommend 3-day blood pressure check.

## 2020-07-21 NOTE — ED Provider Notes (Signed)
[facility] Emergency Department Provider Note   ____________________________________________   Event Date/Time   First MD Initiated Contact with Patient 07/21/20 1037     (approximate)  I have reviewed the triage vital signs and the nursing notes.   HISTORY  Chief Complaint Abscess    HPI Bryan Garrett is a 45 y.o. male patient presents with redness and swelling to the left upper back.  Patient noticed lesion upon a.m. awakening.  No fever or drainage.  Patient rates pain as a 6/10.  Described pain as "sore".  Pain increased with pressure applied and overhead reaching.  No provocative incident for complaint.         History reviewed. No pertinent past medical history.  There are no problems to display for this patient.   Past Surgical History:  Procedure Laterality Date  . NO PAST SURGERIES      Prior to Admission medications   Medication Sig Start Date End Date Taking? Authorizing Provider  naproxen (NAPROSYN) 500 MG tablet Take 1 tablet (500 mg total) by mouth 2 (two) times daily with a meal. 07/21/20  Yes Joni Reining, PA-C  sulfamethoxazole-trimethoprim (BACTRIM DS) 800-160 MG tablet Take 1 tablet by mouth 2 (two) times daily. 07/21/20  Yes Joni Reining, PA-C  albuterol (VENTOLIN HFA) 108 (90 Base) MCG/ACT inhaler Inhale 1-2 puffs into the lungs every 6 (six) hours as needed for wheezing or shortness of breath. 11/17/19   Wallis Bamberg, PA-C  benzonatate (TESSALON) 100 MG capsule Take 2 capsules (200 mg total) by mouth 3 (three) times daily as needed for cough. 11/21/19   Rodriguez-Southworth, Nettie Elm, PA-C  promethazine-dextromethorphan (PROMETHAZINE-DM) 6.25-15 MG/5ML syrup Take 5 mLs by mouth at bedtime as needed for cough. 11/17/19   Wallis Bamberg, PA-C  tiZANidine (ZANAFLEX) 4 MG tablet Take 1 tablet (4 mg total) by mouth every 6 (six) hours as needed for muscle spasms. 11/17/19   Wallis Bamberg, PA-C    Allergies Patient has no known  allergies.  Family History  Problem Relation Age of Onset  . Healthy Mother   . Healthy Father     Social History Social History   Tobacco Use  . Smoking status: Current Every Day Smoker    Packs/day: 0.50    Types: Cigarettes  . Smokeless tobacco: Never Used  Vaping Use  . Vaping Use: Former  Substance Use Topics  . Alcohol use: Yes    Comment: occ  . Drug use: Not Currently    Review of Systems Constitutional: No fever/chills Eyes: No visual changes. ENT: No sore throat. Cardiovascular: Denies chest pain. Respiratory: Denies shortness of breath. Gastrointestinal: No abdominal pain.  No nausea, no vomiting.  No diarrhea.  No constipation. Genitourinary: Negative for dysuria. Musculoskeletal: Negative for back pain. Skin: Nodule lesion right posterior shoulder Neurological: Negative for headaches, focal weakness or numbness.   ____________________________________________   PHYSICAL EXAM:  VITAL SIGNS: ED Triage Vitals  Enc Vitals Group     BP 07/21/20 0957 (!) 162/115     Pulse Rate 07/21/20 0957 62     Resp 07/21/20 0957 18     Temp 07/21/20 0957 98.1 F (36.7 C)     Temp Source 07/21/20 0957 Oral     SpO2 07/21/20 0957 99 %     Weight 07/21/20 0958 170 lb (77.1 kg)     Height 07/21/20 0958 5\' 5"  (1.651 m)     Head Circumference --      Peak Flow --  Pain Score 07/21/20 0958 6     Pain Loc --      Pain Edu? --      Excl. in GC? --    Constitutional: Alert and oriented. Well appearing and in no acute distress. Cardiovascular: Normal rate, regular rhythm. Grossly normal heart sounds.  Good peripheral circulation. Respiratory: Normal respiratory effort.  No retractions. Lungs CTAB. Gastrointestinal: Soft and nontender. No distention. No abdominal bruits. No CVA tenderness. Musculoskeletal: No lower extremity tenderness nor edema.  No joint effusions. Neurologic:  Normal speech and language. No gross focal neurologic deficits are appreciated. No gait  instability. Skin: Nonfluctuant nodule lesion superior aspect of posterior left shoulder  psychiatric: Mood and affect are normal. Speech and behavior are normal.  ____________________________________________   LABS (all labs ordered are listed, but only abnormal results are displayed)  Labs Reviewed - No data to display ____________________________________________  EKG   ____________________________________________  RADIOLOGY I, Joni Reining, personally viewed and evaluated these images (plain radiographs) as part of my medical decision making, as well as reviewing the written report by the radiologist.  ED MD interpretation:    Official radiology report(s): No results found.  ____________________________________________   PROCEDURES  Procedure(s) performed (including Critical Care):  Procedures   ____________________________________________   INITIAL IMPRESSION / ASSESSMENT AND PLAN / ED COURSE  As part of my medical decision making, I reviewed the following data within the electronic MEDICAL RECORD NUMBER         Patient presents with pain secondary to a nodular lesion posterior left superior aspect of shoulder.  Discussed with patient rationale for not incision and draining this nonfluctuant lesion.  Patient given discharge care instruction advised take medication as directed.  Return to ED if condition worsens.   ____________________________________________   FINAL CLINICAL IMPRESSION(S) / ED DIAGNOSES  Final diagnoses:  Abscess  Elevated blood pressure reading     ED Discharge Orders         Ordered    sulfamethoxazole-trimethoprim (BACTRIM DS) 800-160 MG tablet  2 times daily        07/21/20 1104    naproxen (NAPROSYN) 500 MG tablet  2 times daily with meals        07/21/20 1104          *Please note:  Bryan Garrett was evaluated in Emergency Department on 07/21/2020 for the symptoms described in the history of present illness. He was  evaluated in the context of the global COVID-19 pandemic, which necessitated consideration that the patient might be at risk for infection with the SARS-CoV-2 virus that causes COVID-19. Institutional protocols and algorithms that pertain to the evaluation of patients at risk for COVID-19 are in a state of rapid change based on information released by regulatory bodies including the CDC and federal and state organizations. These policies and algorithms were followed during the patient's care in the ED.  Some ED evaluations and interventions may be delayed as a result of limited staffing during and the pandemic.*   Note:  This document was prepared using Dragon voice recognition software and may include unintentional dictation errors.    Joni Reining, PA-C 07/21/20 1111    Dionne Bucy, MD 07/21/20 1119

## 2020-07-21 NOTE — ED Notes (Signed)
Provided DC instructions. Verbalized understanding.  

## 2020-07-22 ENCOUNTER — Telehealth: Payer: Self-pay | Admitting: *Deleted

## 2020-07-22 NOTE — Telephone Encounter (Signed)
Transition Care Management Follow-up Telephone Call  Date of discharge and from where: 07/21/2020 [facility] ED  How have you been since you were released from the hospital? "Feeling a little better"  Any questions or concerns? No  Items Reviewed:  Did the pt receive and understand the discharge instructions provided? Yes   Medications obtained and verified? Yes   Other? No   Any new allergies since your discharge? No   Dietary orders reviewed? No  Do you have support at home? Yes    Functional Questionnaire: (I = Independent and D = Dependent) ADLs: I  Bathing/Dressing- I  Meal Prep- I  Eating- I  Maintaining continence- I  Transferring/Ambulation- I  Managing Meds- I  Follow up appointments reviewed:   PCP Hospital f/u appt confirmed? No    Specialist Hospital f/u appt confirmed? No    Are transportation arrangements needed? N/A  If their condition worsens, is the pt aware to call PCP or go to the Emergency Dept.? Yes  Was the patient provided with contact information for the PCP's office or ED? Yes  Was to pt encouraged to call back with questions or concerns? Yes

## 2020-12-09 ENCOUNTER — Emergency Department
Admission: EM | Admit: 2020-12-09 | Discharge: 2020-12-09 | Disposition: A | Discharge status: Home / Self Care | Payer: Medicaid Other | Attending: Emergency Medicine | Admitting: Emergency Medicine

## 2020-12-09 ENCOUNTER — Other Ambulatory Visit: Payer: Self-pay

## 2020-12-09 ENCOUNTER — Emergency Department: Payer: Medicaid Other

## 2020-12-09 DIAGNOSIS — Z20822 Contact with and (suspected) exposure to covid-19: Secondary | ICD-10-CM | POA: Diagnosis not present

## 2020-12-09 DIAGNOSIS — F17210 Nicotine dependence, cigarettes, uncomplicated: Secondary | ICD-10-CM | POA: Diagnosis not present

## 2020-12-09 DIAGNOSIS — J40 Bronchitis, not specified as acute or chronic: Secondary | ICD-10-CM | POA: Diagnosis not present

## 2020-12-09 DIAGNOSIS — R0602 Shortness of breath: Secondary | ICD-10-CM | POA: Diagnosis not present

## 2020-12-09 DIAGNOSIS — J209 Acute bronchitis, unspecified: Secondary | ICD-10-CM | POA: Insufficient documentation

## 2020-12-09 DIAGNOSIS — R059 Cough, unspecified: Secondary | ICD-10-CM | POA: Diagnosis present

## 2020-12-09 LAB — RESP PANEL BY RT-PCR (FLU A&B, COVID) ARPGX2
Influenza A by PCR: NEGATIVE
Influenza B by PCR: NEGATIVE
SARS Coronavirus 2 by RT PCR: NEGATIVE

## 2020-12-09 MED ORDER — GUAIFENESIN ER 600 MG PO TB12: 600.0000 mg | ORAL_TABLET | Freq: Two times a day (BID) | ORAL | 0 refills | Status: AC

## 2020-12-09 MED ORDER — ALBUTEROL SULFATE HFA 108 (90 BASE) MCG/ACT IN AERS: 2.0000 | INHALATION_SPRAY | Freq: Four times a day (QID) | RESPIRATORY_TRACT | 0 refills | Status: AC | PRN

## 2020-12-09 NOTE — ED Notes (Signed)
RN to bedside. Pt  CAOx4 in no distress.

## 2020-12-09 NOTE — ED Triage Notes (Signed)
Pt c/o cough with congestion since last Friday , states he has been taking OTC meds with no relied, pt is in NAD on arrival

## 2020-12-09 NOTE — ED Provider Notes (Signed)
[facility] Emergency Department Provider Note   ____________________________________________   Event Date/Time   First MD Initiated Contact with Patient 12/09/20 1123     (approximate)  I have reviewed the triage vital signs and the nursing notes.   HISTORY  Chief Complaint URI    HPI Bryan Garrett is a 45 y.o. male with no significant past medical history presents to the ED complaining of cough.  Patient reports that he has had 3 days of gradually worsening productive cough with thick yellowish sputum production.  He denies any fevers or chest pain, does state that he has been feeling short of breath at night.  He additionally reports congestion and burning discomfort around his maxillary sinuses, has not noticed any nasal drainage.  He denies any sick contacts, has not had any abdominal pain, nausea, or vomiting.        History reviewed. No pertinent past medical history.  There are no problems to display for this patient.   Past Surgical History:  Procedure Laterality Date   NO PAST SURGERIES      Prior to Admission medications   Medication Sig Start Date End Date Taking? Authorizing Provider  albuterol (VENTOLIN HFA) 108 (90 Base) MCG/ACT inhaler Inhale 2 puffs into the lungs every 6 (six) hours as needed for wheezing or shortness of breath. 12/09/20  Yes Chesley Noon, MD  guaiFENesin (MUCINEX) 600 MG 12 hr tablet Take 1 tablet (600 mg total) by mouth 2 (two) times daily for 7 days. 12/09/20 12/16/20 Yes Chesley Noon, MD  benzonatate (TESSALON) 100 MG capsule Take 2 capsules (200 mg total) by mouth 3 (three) times daily as needed for cough. 11/21/19   Rodriguez-Southworth, Nettie Elm, PA-C  naproxen (NAPROSYN) 500 MG tablet Take 1 tablet (500 mg total) by mouth 2 (two) times daily with a meal. 07/21/20   Joni Reining, PA-C  promethazine-dextromethorphan (PROMETHAZINE-DM) 6.25-15 MG/5ML syrup Take 5 mLs by mouth at bedtime as needed for  cough. 11/17/19   Wallis Bamberg, PA-C  sulfamethoxazole-trimethoprim (BACTRIM DS) 800-160 MG tablet Take 1 tablet by mouth 2 (two) times daily. 07/21/20   Joni Reining, PA-C  tiZANidine (ZANAFLEX) 4 MG tablet Take 1 tablet (4 mg total) by mouth every 6 (six) hours as needed for muscle spasms. 11/17/19   Wallis Bamberg, PA-C    Allergies Patient has no known allergies.  Family History  Problem Relation Age of Onset   Healthy Mother    Healthy Father     Social History Social History   Tobacco Use   Smoking status: Every Day    Packs/day: 0.50    Types: Cigarettes   Smokeless tobacco: Never  Vaping Use   Vaping Use: Former  Substance Use Topics   Alcohol use: Yes    Comment: occ   Drug use: Not Currently    Review of Systems  Constitutional: No fever/chills Eyes: No visual changes. ENT: No sore throat.  Positive for sinus congestion. Cardiovascular: Denies chest pain. Respiratory: Positive for cough and shortness of breath. Gastrointestinal: No abdominal pain.  No nausea, no vomiting.  No diarrhea.  No constipation. Genitourinary: Negative for dysuria. Musculoskeletal: Negative for back pain. Skin: Negative for rash. Neurological: Negative for headaches, focal weakness or numbness.  ____________________________________________   PHYSICAL EXAM:  VITAL SIGNS: ED Triage Vitals [12/09/20 1109]  Enc Vitals Group     BP (!) 131/94     Pulse Rate 78     Resp 18  Temp 98.4 F (36.9 C)     Temp Source Oral     SpO2 95 %     Weight      Height      Head Circumference      Peak Flow      Pain Score      Pain Loc      Pain Edu?      Excl. in GC?     Constitutional: Alert and oriented. Eyes: Conjunctivae are normal. Head: Atraumatic. Nose: No congestion/rhinnorhea. Mouth/Throat: Mucous membranes are moist. Neck: Normal ROM Cardiovascular: Normal rate, regular rhythm. Grossly normal heart sounds.  2+ radial pulses bilaterally. Respiratory: Normal respiratory  effort.  No retractions. Lungs CTAB. Gastrointestinal: Soft and nontender. No distention. Genitourinary: deferred Musculoskeletal: No lower extremity tenderness nor edema. Neurologic:  Normal speech and language. No gross focal neurologic deficits are appreciated. Skin:  Skin is warm, dry and intact. No rash noted. Psychiatric: Mood and affect are normal. Speech and behavior are normal.  ____________________________________________   LABS (all labs ordered are listed, but only abnormal results are displayed)  Labs Reviewed  RESP PANEL BY RT-PCR (FLU A&B, COVID) ARPGX2     PROCEDURES  Procedure(s) performed (including Critical Care):  Procedures   ____________________________________________   INITIAL IMPRESSION / ASSESSMENT AND PLAN / ED COURSE      45 year old male with no significant past medical history presents to the ED with 3 days of productive cough, sinus pressure, and shortness of breath at night.  He is not in any respiratory distress and is maintaining O2 sats on room air, lungs are clear to auscultation bilaterally.  Symptoms seem consistent with a viral bronchitis, we will screen chest x-ray for pneumonia and send test for COVID-19.  If these are unremarkable, patient be appropriate for discharge home with symptomatic management of bronchitis.  Chest x-ray reviewed by me and shows no infiltrate, edema, or effusion.  Testing for COVID-19 and influenza is negative.  Patient is appropriate for discharge home, will be prescribed Mucinex and an inhaler.  He was counseled to return to the ED for new worsening symptoms, patient agrees with plan.      ____________________________________________   FINAL CLINICAL IMPRESSION(S) / ED DIAGNOSES  Final diagnoses:  Bronchitis     ED Discharge Orders          Ordered    albuterol (VENTOLIN HFA) 108 (90 Base) MCG/ACT inhaler  Every 6 hours PRN       Note to Pharmacy: Please supply with spacer   12/09/20 1307     guaiFENesin (MUCINEX) 600 MG 12 hr tablet  2 times daily        12/09/20 1307             Note:  This document was prepared using Dragon voice recognition software and may include unintentional dictation errors.    Chesley Noon, MD 12/09/20 1310

## 2020-12-10 ENCOUNTER — Telehealth: Payer: Self-pay

## 2020-12-10 DIAGNOSIS — Z7689 Persons encountering health services in other specified circumstances: Secondary | ICD-10-CM

## 2020-12-10 DIAGNOSIS — Z748 Other problems related to care provider dependency: Secondary | ICD-10-CM

## 2020-12-10 NOTE — Telephone Encounter (Signed)
Transition Care Management Follow-up Telephone Call Date of discharge and from where: [facility] How have you been since you were released from the hospital? Improvement  Any questions or concerns?  No  Items Reviewed: Did the pt receive and understand the discharge instructions provided? Yes  Medications obtained and verified? Yes  Other? No  Any new allergies since your discharge? No  Dietary orders reviewed? Yes Do you have support at home? Yes   Home Care and Equipment/Supplies: Were home health services ordered? not applicable If so, what is the name of the agency?   Has the agency set up a time to come to the patient's home? not applicable Were any new equipment or medical supplies ordered?  No What is the name of the medical supply agency?  Were you able to get the supplies/equipment? not applicable Do you have any questions related to the use of the equipment or supplies? No  Functional Questionnaire: (I = Independent and D = Dependent) ADLs: I   Bathing/Dressing- I  Meal Prep- I  Eating- I  Maintaining continence- I  Transferring/Ambulation- I  Managing Meds- I  Follow up appointments reviewed:  PCP Hospital f/u appt confirmed? No  Requested a primary Care. Specialist Hospital f/u appt confirmed? No  . Are transportation arrangements needed? Yes  If their condition worsens, is the pt aware to call PCP or go to the Emergency Dept.? Yes Was the patient provided with contact information for the PCP's office or ED? Yes Was to pt encouraged to call back with questions or concerns? Yes

## 2021-01-06 ENCOUNTER — Emergency Department
Admission: EM | Admit: 2021-01-06 | Discharge: 2021-01-06 | Disposition: A | Discharge status: Home / Self Care | Payer: Medicaid Other | Attending: Emergency Medicine | Admitting: Emergency Medicine

## 2021-01-06 ENCOUNTER — Other Ambulatory Visit: Payer: Self-pay

## 2021-01-06 ENCOUNTER — Encounter: Payer: Self-pay | Admitting: Physician Assistant

## 2021-01-06 DIAGNOSIS — R059 Cough, unspecified: Secondary | ICD-10-CM | POA: Diagnosis present

## 2021-01-06 DIAGNOSIS — J101 Influenza due to other identified influenza virus with other respiratory manifestations: Secondary | ICD-10-CM | POA: Insufficient documentation

## 2021-01-06 DIAGNOSIS — Z20822 Contact with and (suspected) exposure to covid-19: Secondary | ICD-10-CM | POA: Diagnosis not present

## 2021-01-06 DIAGNOSIS — F17210 Nicotine dependence, cigarettes, uncomplicated: Secondary | ICD-10-CM | POA: Diagnosis not present

## 2021-01-06 LAB — RESP PANEL BY RT-PCR (FLU A&B, COVID) ARPGX2
Influenza A by PCR: POSITIVE — AB
Influenza B by PCR: NEGATIVE
SARS Coronavirus 2 by RT PCR: NEGATIVE

## 2021-01-06 MED ORDER — OSELTAMIVIR PHOSPHATE 75 MG PO CAPS: 75.0000 mg | ORAL_CAPSULE | Freq: Two times a day (BID) | ORAL | 0 refills | Status: AC

## 2021-01-06 NOTE — ED Notes (Signed)
No answer in lobby or flex wait room when called for room.  x3

## 2021-01-06 NOTE — ED Triage Notes (Signed)
Pt in with co cough and headache since this am, daughter dx with flu.

## 2021-01-06 NOTE — Discharge Instructions (Addendum)
Take the Tamiflu as prescribed.  Take over-the-counter cough medicine as needed.  Monitor your blood pressure as discussed.

## 2021-01-07 ENCOUNTER — Telehealth: Payer: Self-pay

## 2021-01-07 NOTE — Telephone Encounter (Signed)
Transition Care Management Unsuccessful Follow-up Telephone Call  Date of discharge and from where:  01/06/21 from [facility]  Attempts:  1st Attempt  Reason for unsuccessful TCM follow-up call:  Voice mail full

## 2021-01-07 NOTE — ED Provider Notes (Signed)
[facility] Emergency Department Provider Note ____________________________________________  Time seen: 2128  I have reviewed the triage vital signs and the nursing notes.  HISTORY  Chief Complaint  Cough   HPI Teagon Kron is a 45 y.o. male presents to the ED with cough and headache since this morning.  He does note he had recent contact with his daughter who was confirmed positive.  He denies any frank fevers at this time.  He denies a diagnosis of high blood pressure or previously been treated for elevated blood pressure.  History reviewed. No pertinent past medical history.  There are no problems to display for this patient.   Past Surgical History:  Procedure Laterality Date   NO PAST SURGERIES      Prior to Admission medications   Medication Sig Start Date End Date Taking? Authorizing Provider  oseltamivir (TAMIFLU) 75 MG capsule Take 1 capsule (75 mg total) by mouth 2 (two) times daily for 5 days. 01/06/21 01/11/21 Yes Lucresha Dismuke, Charlesetta Ivory, PA-C  albuterol (VENTOLIN HFA) 108 (90 Base) MCG/ACT inhaler Inhale 2 puffs into the lungs every 6 (six) hours as needed for wheezing or shortness of breath. 12/09/20   Chesley Noon, MD  promethazine-dextromethorphan (PROMETHAZINE-DM) 6.25-15 MG/5ML syrup Take 5 mLs by mouth at bedtime as needed for cough. 11/17/19   Wallis Bamberg, PA-C    Allergies Patient has no known allergies.  Family History  Problem Relation Age of Onset   Healthy Mother    Healthy Father     Social History Social History   Tobacco Use   Smoking status: Every Day    Packs/day: 0.50    Types: Cigarettes   Smokeless tobacco: Never  Vaping Use   Vaping Use: Former  Substance Use Topics   Alcohol use: Yes    Comment: occ   Drug use: Not Currently    Review of Systems  Constitutional: Negative for fever. Eyes: Negative for visual changes. ENT: Negative for sore throat. Cardiovascular: Negative for chest  pain. Respiratory: Negative for shortness of breath.  Reports cough gastrointestinal: Negative for abdominal pain, vomiting and diarrhea. Genitourinary: Negative for dysuria. Musculoskeletal: Negative for back pain. Skin: Negative for rash. Neurological: Positive  for headaches.  Denies focal weakness or numbness. ____________________________________________  PHYSICAL EXAM:  VITAL SIGNS: ED Triage Vitals  Enc Vitals Group     BP 01/06/21 1906 (!) 190/108     Pulse Rate 01/06/21 1906 98     Resp 01/06/21 1906 20     Temp 01/06/21 1906 98.7 F (37.1 C)     Temp Source 01/06/21 1906 Oral     SpO2 01/06/21 1906 96 %     Weight 01/06/21 1907 170 lb (77.1 kg)     Height 01/06/21 1907 5\' 5"  (1.651 m)     Head Circumference --      Peak Flow --      Pain Score 01/06/21 1907 1     Pain Loc --      Pain Edu? --      Excl. in GC? --     Constitutional: Alert and oriented. Well appearing and in no distress. Head: Normocephalic and atraumatic. Eyes: Conjunctivae are normal. Normal extraocular movements Cardiovascular: Normal rate, regular rhythm. Normal distal pulses. Respiratory: Normal respiratory effort. No wheezes/rales/rhonchi. Gastrointestinal: Soft and nontender. No distention. Musculoskeletal: Nontender with normal range of motion in all extremities.  Neurologic:  Normal gait without ataxia. Normal speech and language. No gross focal neurologic deficits are  appreciated. Skin:  Skin is warm, dry and intact. No rash noted. ____________________________________________    {LABS (pertinent positives/negatives) Labs Reviewed  RESP PANEL BY RT-PCR (FLU A&B, COVID) ARPGX2 - Abnormal; Notable for the following components:      Result Value   Influenza A by PCR POSITIVE (*)    All other components within normal limits  ___________________________________________  {EKG  ____________________________________________   RADIOLOGY Official radiology report(s): No results  found. ____________________________________________  PROCEDURES   Procedures ____________________________________________   INITIAL IMPRESSION / ASSESSMENT AND PLAN / ED COURSE  As part of my medical decision making, I reviewed the following data within the electronic MEDICAL RECORD NUMBER Labs reviewed as noted and Notes from prior ED visits   DDX: influenza, Covid, RSV  Patient ED evaluation of cough after a flu positive contact.  He presents to the ED in stable condition he is noted to have elevated BP without diagnosis of hypertension.  Otherwise he is stable and his viral panel screen does confirm influenza.  Patient is discharged with a prescription for Tamiflu as requested.  He is also given prescription for cough medicine and will follow with primary provider or local urgent care for ongoing symptoms peer return precautions been reviewed.  Takoda Janowiak was evaluated in Emergency Department on 01/07/2021 for the symptoms described in the history of present illness. He was evaluated in the context of the global COVID-19 pandemic, which necessitated consideration that the patient might be at risk for infection with the SARS-CoV-2 virus that causes COVID-19. Institutional protocols and algorithms that pertain to the evaluation of patients at risk for COVID-19 are in a state of rapid change based on information released by regulatory bodies including the CDC and federal and state organizations. These policies and algorithms were followed during the patient's care in the ED. ____________________________________________  FINAL CLINICAL IMPRESSION(S) / ED DIAGNOSES  Final diagnoses:  Influenza A      Karmen Stabs, Charlesetta Ivory, PA-C 01/07/21 2320    Arnaldo Natal, MD 01/12/21 0045

## 2021-01-10 NOTE — Telephone Encounter (Signed)
Transition Care Management Unsuccessful Follow-up Telephone Call  Date of discharge and from where:  01/06/2021 from [facility]  Attempts:  2nd Attempt  Reason for unsuccessful TCM follow-up call:  Voice mail full

## 2021-01-11 NOTE — Telephone Encounter (Signed)
Transition Care Management Unsuccessful Follow-up Telephone Call  Date of discharge and from where:  01/06/2021 from [facility]  Attempts:  3rd Attempt  Reason for unsuccessful TCM follow-up call:  Unable to reach patient

## 2021-03-09 ENCOUNTER — Emergency Department
Admission: EM | Admit: 2021-03-09 | Discharge: 2021-03-09 | Discharge status: Against Medical Advice | Payer: Medicaid Other

## 2021-03-09 NOTE — ED Notes (Signed)
Attempted to call pt to triage, no answer.

## 2021-03-11 ENCOUNTER — Emergency Department: Payer: Medicaid Other

## 2021-03-11 ENCOUNTER — Emergency Department
Admission: EM | Admit: 2021-03-11 | Discharge: 2021-03-11 | Disposition: A | Discharge status: Home / Self Care | Payer: Medicaid Other | Attending: Emergency Medicine | Admitting: Emergency Medicine

## 2021-03-11 ENCOUNTER — Encounter: Payer: Self-pay | Admitting: Emergency Medicine

## 2021-03-11 ENCOUNTER — Other Ambulatory Visit: Payer: Self-pay

## 2021-03-11 DIAGNOSIS — J209 Acute bronchitis, unspecified: Secondary | ICD-10-CM | POA: Insufficient documentation

## 2021-03-11 DIAGNOSIS — R059 Cough, unspecified: Secondary | ICD-10-CM | POA: Diagnosis present

## 2021-03-11 MED ORDER — PREDNISONE 10 MG (21) PO TBPK: ORAL_TABLET | ORAL | 0 refills | Status: AC

## 2021-03-11 MED ORDER — BENZONATATE 100 MG PO CAPS: 100.0000 mg | ORAL_CAPSULE | Freq: Three times a day (TID) | ORAL | 0 refills | Status: AC | PRN

## 2021-03-11 MED ORDER — AZITHROMYCIN 250 MG PO TABS: ORAL_TABLET | ORAL | 0 refills | Status: AC

## 2021-03-11 NOTE — ED Provider Notes (Signed)
[facility] Provider Note    None    (approximate)   History   Cough   HPI  Bryan Garrett is a 46 y.o. male presents emergency department with cough and congestion for 1 month.  No fever or chills.  States he is coughing up yellow and brown mucus.  Denies chest pain or shortness of breath.  No recent COVID.  Did have the flu recently.      Physical Exam   Triage Vital Signs: ED Triage Vitals  Enc Vitals Group     BP 03/11/21 1042 138/80     Pulse Rate 03/11/21 1039 92     Resp 03/11/21 1039 16     Temp 03/11/21 1039 98 F (36.7 C)     Temp Source 03/11/21 1039 Oral     SpO2 03/11/21 1039 100 %     Weight 03/11/21 1040 169 lb 15.6 oz (77.1 kg)     Height 03/11/21 1040 5\' 5"  (1.651 m)     Head Circumference --      Peak Flow --      Pain Score 03/11/21 1040 0     Pain Loc --      Pain Edu? --      Excl. in GC? --     Most recent vital signs: Vitals:   03/11/21 1039 03/11/21 1042  BP:  138/80  Pulse: 92   Resp: 16   Temp: 98 F (36.7 C)   SpO2: 100%      General: Awake, no distress.   CV:  Good peripheral perfusion.  Normal rate and rhythm Resp:  Normal effort.  Lungs CTA, cough is dry Abd:  No distention.   Other:  Moves extremities without difficulty   ED Results / Procedures / Treatments   Labs (all labs ordered are listed, but only abnormal results are displayed) Labs Reviewed - No data to display   EKG     RADIOLOGY Chest x-ray    PROCEDURES:  Critical Care performed:   Procedures   MEDICATIONS ORDERED IN ED: Medications - No data to display   IMPRESSION / MDM / ASSESSMENT AND PLAN / ED COURSE  I reviewed the triage vital signs and the nursing notes.                              Differential diagnosis includes, but is not limited to, acute bronchitis, CAP, viral URI, smoker's cough  Patient is a 46 year old male presents emergency department with cough x1 month.  Patient had influenza few  weeks ago.  Patient started taking Septra at home that he had leftover from an abscess.  He has not had any relief.  States mucus is brown and yellow.  Due to the recent influenza wound concerns for pneumonia will order chest x-ray.  Chest x-ray was reviewed by me to be negative for pneumonia or any other acute abnormality.  This was confirmed by radiology I did discuss these findings with patient.  He was given a prescription for Z-Pak and Tessalon Perles along with a steroid.  He is to follow-up with his regular doctor if not improved in 3 days.  Return emergency department if worsening.  Is given a work note and discharged in stable condition.        FINAL CLINICAL IMPRESSION(S) / ED DIAGNOSES   Final diagnoses:  Acute bronchitis, unspecified organism     Rx / DC  Orders   ED Discharge Orders          Ordered    azithromycin (ZITHROMAX Z-PAK) 250 MG tablet        03/11/21 1140    benzonatate (TESSALON PERLES) 100 MG capsule  3 times daily PRN        03/11/21 1140    predniSONE (STERAPRED UNI-PAK 21 TAB) 10 MG (21) TBPK tablet        03/11/21 1140             Note:  This document was prepared using Dragon voice recognition software and may include unintentional dictation errors.    Faythe Ghee, PA-C 03/11/21 1146    Georga Hacking, MD 03/11/21 [phone]

## 2021-03-11 NOTE — ED Provider Triage Note (Signed)
Emergency Medicine Provider Triage Evaluation Note  Bryan Garrett , a 46 y.o. male  was evaluated in triage.  Pt complains of cough for 1 month.  Denies history of asthma.  Patient smokes 1/2 pack cigarettes per day.  Review of Systems  Positive: Productive cough. Negative: Negative for fever or chills.  Physical Exam  Pulse 92    Temp 98 F (36.7 C) (Oral)    Resp 16    SpO2 100%  Gen:   Awake, no distress is able to talk in complete sentences that any difficulty. Resp:  Normal effort, lungs are clear bilaterally. MSK:   Moves extremities without difficulty  Other:  No rash, nontoxic in appearance.  Medical Decision Making  Medically screening exam initiated at 10:40 AM.  Appropriate orders placed.  Bryan Garrett was informed that the remainder of the evaluation will be completed by another provider, this initial triage assessment does not replace that evaluation, and the importance of remaining in the ED until their evaluation is complete.     Tommi Rumps, PA-C 03/11/21 1106

## 2021-03-11 NOTE — ED Triage Notes (Signed)
Cough and congestion x 1 month. Denies recent fevers.  AAOx3.  Skin warm and dry. NAD. No sob/doe

## 2021-03-14 ENCOUNTER — Telehealth: Payer: Self-pay

## 2021-03-14 NOTE — Telephone Encounter (Signed)
Transition Care Management Follow-up Telephone Call Date of discharge and from where: 03/11/2021-[facility] How have you been since you were released from the hospital? Patient stated he is doing fine. He declined a referral for assistance with finding a PCP.  Any questions or concerns? No  Items Reviewed: Did the pt receive and understand the discharge instructions provided? Yes  Medications obtained and verified? Yes  Other? No  Any new allergies since your discharge? No  Dietary orders reviewed? No Do you have support at home? Yes   Home Care and Equipment/Supplies: Were home health services ordered? not applicable If so, what is the name of the agency? N/A  Has the agency set up a time to come to the patient's home? not applicable Were any new equipment or medical supplies ordered?  No What is the name of the medical supply agency? N/A Were you able to get the supplies/equipment? not applicable Do you have any questions related to the use of the equipment or supplies? No  Functional Questionnaire: (I = Independent and D = Dependent) ADLs: I  Bathing/Dressing- I  Meal Prep- I  Eating- I  Maintaining continence- I  Transferring/Ambulation- I  Managing Meds- I  Follow up appointments reviewed:  PCP Hospital f/u appt confirmed? No   Specialist Hospital f/u appt confirmed? No   Are transportation arrangements needed? No  If their condition worsens, is the pt aware to call PCP or go to the Emergency Dept.? Yes Was the patient provided with contact information for the PCP's office or ED? Yes Was to pt encouraged to call back with questions or concerns? Yes

## 2021-11-24 ENCOUNTER — Telehealth: Payer: Self-pay

## 2021-11-24 NOTE — Telephone Encounter (Signed)
Patient called to connect with a Primary Care Provider. Patient has Managed Medicaid. LVM for patient to call [phone] to discuss making an appoint with a Primary Care Provider. If patient returns call, please reach out to Romayne Ticas or Leslie, RN.   

## 2021-12-01 ENCOUNTER — Telehealth: Payer: Self-pay

## 2021-12-01 NOTE — Telephone Encounter (Signed)
Patient called to connect with a Primary Care Provider. Patient has Managed Medicaid. LVM for patient to call [phone] to discuss making an appoint with a Primary Care Provider. If patient returns call, please reach out to Mechele Collin, Therapist, sports.

## 2021-12-07 ENCOUNTER — Telehealth: Payer: Self-pay

## 2021-12-07 NOTE — Telephone Encounter (Signed)
Patient called to connect with a Primary Care Provider. Patient has Managed Medicaid. LVM for patient to call [phone] to discuss making an appoint with a Primary Care Provider. If patient returns call, please reach out to Halbert Jesson or Leslie, RN.   

## 2022-06-10 IMAGING — CR DG CHEST 1V PORT
1 series · 1 of 1 positions shown · non-contrast
Comparison: None.

CLINICAL DATA: cough x 1 month

EXAM:
PORTABLE CHEST 1 VIEW

[w chest pa]
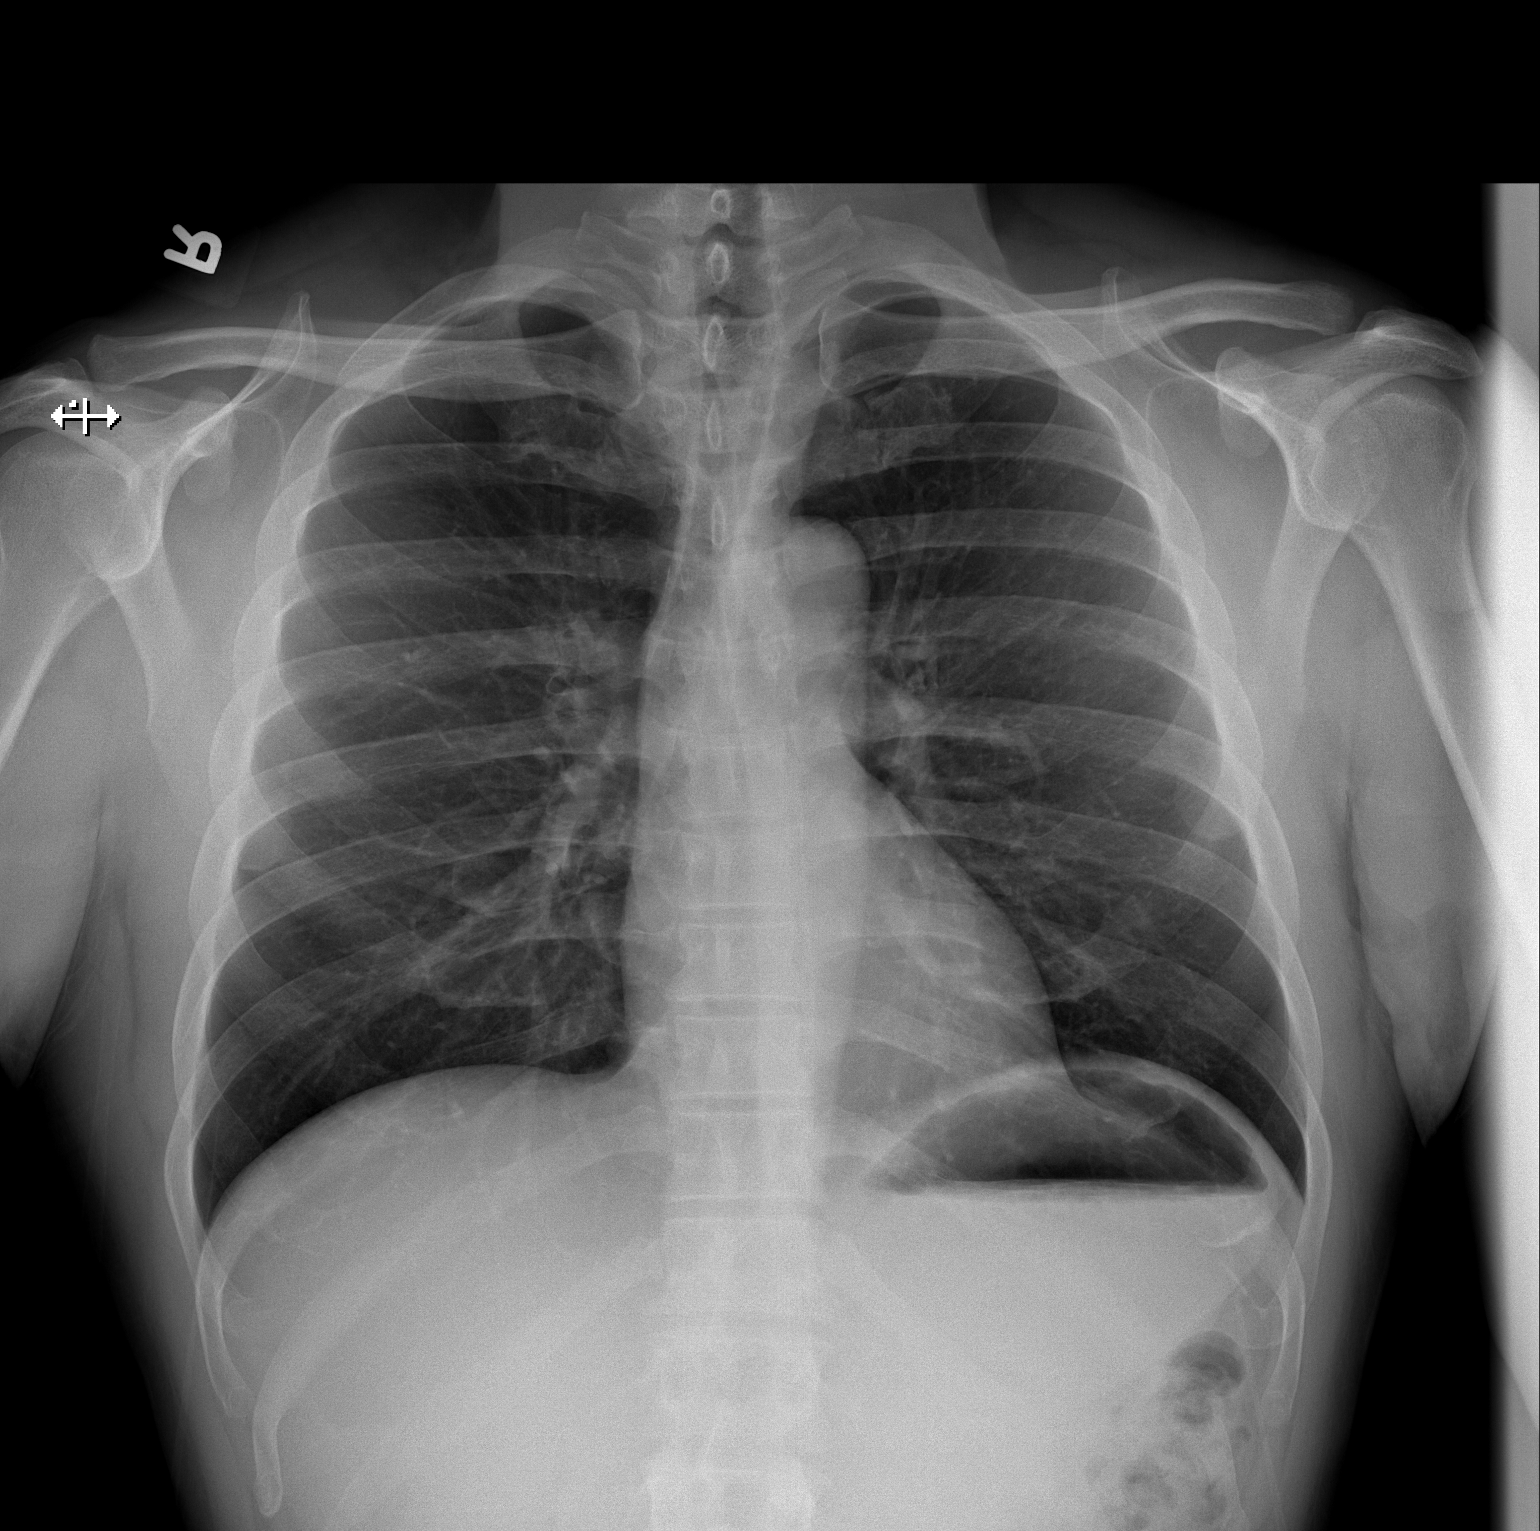

[1 of 1 positions shown; findings below may reference images not displayed]

FINDINGS: The cardiomediastinal silhouette is within normal limits. No pleural
effusion. No pneumothorax. No mass or consolidation. No acute
osseous abnormality.
IMPRESSION: No acute findings in the chest.  No consolidative pneumonia.
